# Patient Record
Sex: Male | Born: 1980 | Race: Black or African American | Hispanic: No | Marital: Single | Smoking: Current some day smoker
Health system: Southern US, Community
[De-identification: ages and names within clinical notes are randomized; demographics above are authoritative.]

## PROBLEM LIST (undated history)

## (undated) DIAGNOSIS — I2699 Other pulmonary embolism without acute cor pulmonale: Secondary | ICD-10-CM

---
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
## 2001-09-17 ENCOUNTER — Emergency Department (HOSPITAL_COMMUNITY): Admission: EM | Admit: 2001-09-17 | Discharge: 2001-09-17 | Payer: Self-pay | Admitting: Emergency Medicine

## 2001-09-17 ENCOUNTER — Encounter: Payer: Self-pay | Admitting: Emergency Medicine

## 2002-07-17 ENCOUNTER — Encounter: Payer: Self-pay | Admitting: Emergency Medicine

## 2002-07-17 ENCOUNTER — Emergency Department (HOSPITAL_COMMUNITY): Admission: EM | Admit: 2002-07-17 | Discharge: 2002-07-17 | Payer: Self-pay | Admitting: *Deleted

## 2006-12-04 ENCOUNTER — Emergency Department (HOSPITAL_COMMUNITY): Admission: EM | Admit: 2006-12-04 | Discharge: 2006-12-04 | Payer: Self-pay | Admitting: Emergency Medicine

## 2007-04-12 ENCOUNTER — Emergency Department (HOSPITAL_COMMUNITY): Admission: EM | Admit: 2007-04-12 | Discharge: 2007-04-13 | Payer: Self-pay | Admitting: *Deleted

## 2007-12-29 ENCOUNTER — Emergency Department (HOSPITAL_COMMUNITY): Admission: EM | Admit: 2007-12-29 | Discharge: 2007-12-29 | Payer: Self-pay | Admitting: Emergency Medicine

## 2008-03-15 ENCOUNTER — Emergency Department (HOSPITAL_COMMUNITY): Admission: EM | Admit: 2008-03-15 | Discharge: 2008-03-15 | Payer: Self-pay | Admitting: Emergency Medicine

## 2008-07-25 ENCOUNTER — Emergency Department (HOSPITAL_COMMUNITY): Admission: EM | Admit: 2008-07-25 | Discharge: 2008-07-25 | Payer: Self-pay | Admitting: Emergency Medicine

## 2010-06-30 ENCOUNTER — Emergency Department (HOSPITAL_COMMUNITY): Admission: EM | Admit: 2010-06-30 | Discharge: 2010-06-30 | Payer: Self-pay | Admitting: Emergency Medicine

## 2010-08-08 ENCOUNTER — Emergency Department (HOSPITAL_COMMUNITY): Admission: EM | Admit: 2010-08-08 | Discharge: 2010-08-08 | Payer: Self-pay | Admitting: Emergency Medicine

## 2011-02-21 LAB — CBC
HCT: 37.9 % — ABNORMAL LOW (ref 39.0–52.0)
Hemoglobin: 12.8 g/dL — ABNORMAL LOW (ref 13.0–17.0)
WBC: 6.9 10*3/uL (ref 4.0–10.5)

## 2011-02-21 LAB — DIFFERENTIAL
Lymphocytes Relative: 33 % (ref 12–46)
Lymphs Abs: 2.3 10*3/uL (ref 0.7–4.0)
Monocytes Absolute: 0.5 10*3/uL (ref 0.1–1.0)
Monocytes Relative: 7 % (ref 3–12)
Neutro Abs: 3.9 10*3/uL (ref 1.7–7.7)

## 2011-02-21 LAB — POCT I-STAT, CHEM 8
BUN: 14 mg/dL (ref 6–23)
Calcium, Ion: 1 mmol/L — ABNORMAL LOW (ref 1.12–1.32)
Chloride: 106 mEq/L (ref 96–112)
Creatinine, Ser: 1.3 mg/dL (ref 0.4–1.5)
Glucose, Bld: 86 mg/dL (ref 70–99)

## 2011-02-23 LAB — DIFFERENTIAL
Basophils Relative: 1 % (ref 0–1)
Monocytes Absolute: 0.9 10*3/uL (ref 0.1–1.0)
Monocytes Relative: 8 % (ref 3–12)
Neutro Abs: 9 10*3/uL — ABNORMAL HIGH (ref 1.7–7.7)

## 2011-02-23 LAB — POCT I-STAT, CHEM 8
Calcium, Ion: 1.08 mmol/L — ABNORMAL LOW (ref 1.12–1.32)
Chloride: 107 mEq/L (ref 96–112)
Glucose, Bld: 104 mg/dL — ABNORMAL HIGH (ref 70–99)
HCT: 39 % (ref 39.0–52.0)

## 2011-02-23 LAB — CBC
HCT: 36 % — ABNORMAL LOW (ref 39.0–52.0)
MCHC: 33.4 g/dL (ref 30.0–36.0)
MCV: 90.7 fL (ref 78.0–100.0)
RDW: 14.1 % (ref 11.5–15.5)
WBC: 11.1 10*3/uL — ABNORMAL HIGH (ref 4.0–10.5)

## 2011-10-22 ENCOUNTER — Encounter: Payer: Self-pay | Admitting: Emergency Medicine

## 2011-10-22 ENCOUNTER — Emergency Department (HOSPITAL_COMMUNITY)
Admission: EM | Admit: 2011-10-22 | Discharge: 2011-10-22 | Disposition: A | Discharge status: Home / Self Care | Payer: Self-pay | Attending: Emergency Medicine | Admitting: Emergency Medicine

## 2011-10-22 ENCOUNTER — Emergency Department (HOSPITAL_COMMUNITY): Payer: Self-pay

## 2011-10-22 DIAGNOSIS — F17200 Nicotine dependence, unspecified, uncomplicated: Secondary | ICD-10-CM | POA: Insufficient documentation

## 2011-10-22 DIAGNOSIS — Y9367 Activity, basketball: Secondary | ICD-10-CM | POA: Insufficient documentation

## 2011-10-22 DIAGNOSIS — S62300A Unspecified fracture of second metacarpal bone, right hand, initial encounter for closed fracture: Secondary | ICD-10-CM

## 2011-10-22 DIAGNOSIS — M79609 Pain in unspecified limb: Secondary | ICD-10-CM | POA: Insufficient documentation

## 2011-10-22 DIAGNOSIS — R296 Repeated falls: Secondary | ICD-10-CM | POA: Insufficient documentation

## 2011-10-22 DIAGNOSIS — S62339A Displaced fracture of neck of unspecified metacarpal bone, initial encounter for closed fracture: Secondary | ICD-10-CM | POA: Insufficient documentation

## 2011-10-22 NOTE — ED Provider Notes (Signed)
History     CSN: [id number] Arrival date & time: 10/22/2011  2:02 AM   First MD Initiated Contact with Patient 10/22/11 0507      Chief Complaint  Patient presents with  . Hand Injury    HPI History provided by the patient. Patient presents with complaints of right hand pain following a basketball injury 2 days ago. Patient states he landed on the ground with partially close right hand. He has pain with movement of index finger and fifth finger. He denies any swelling to hand, no wrist pain, no numbness or tingling. Patient has not taken anything for the pain.   History reviewed. No pertinent past medical history.  History reviewed. No pertinent past surgical history.  History reviewed. No pertinent family history.  History  Substance Use Topics  . Smoking status: Current Everyday Smoker  . Smokeless tobacco: Not on file  . Alcohol Use: Yes     OCCASIONAL      Review of Systems  All other systems reviewed and are negative.    Allergies  Review of patient's allergies indicates no known allergies.  Home Medications  No current outpatient prescriptions on file.  BP 104/59  Pulse 71  Temp(Src) 98.4 F (36.9 C) (Oral)  Resp 22  SpO2 97%  Physical Exam  Nursing note and vitals reviewed. Constitutional: He is oriented to person, place, and time. He appears well-developed and well-nourished.  Cardiovascular: Normal rate and regular rhythm.   Pulmonary/Chest: Effort normal.  Musculoskeletal: Normal range of motion. He exhibits no edema.       Full range of motion in right wrist, hand and digits. Patient reports pain especially with flexion of index finger and fifth finger. There are normal distal sensation and cap refill less than 2 seconds. Patient has normal strength in fingers. He has tenderness to palpation about the distal second metacarpal and mid fifth metacarpal area. No snuffbox tenderness.  Neurological: He is alert and oriented to person, place, and time.    Skin: No rash noted. No erythema.    ED Course  Procedures (including critical care time)  Labs Reviewed - No data to display Dg Hand Complete Right  10/22/2011  *RADIOLOGY REPORT*  Clinical Data:  Larey Seat on closed fist; pain at the second, third and fifth metacarpal phalangeal joints.  RIGHT HAND - COMPLETE 3+ VIEW  Comparison: Right hand radiographs performed 03/15/2008  Findings: There is a mildly depressed comminuted fracture involving the ulnar aspect of the head of the second metacarpal; the chronicity of this fracture is difficult to determine, though it could be acute.  There is stable chronic deformity of the distal fifth metacarpal. No additional fractures are seen.  The joint spaces are preserved; no significant soft tissue abnormalities are characterized on radiograph.  The carpal rows are intact, and demonstrate normal alignment.  IMPRESSION: Mildly depressed comminuted fracture involving the ulnar aspect of the head of the second metacarpal; the chronicity of this fracture is difficult to determine, though it could be acute.  It is new from 2009.  Original Report Authenticated By: Tonia Ghent, M.D.     1. Fracture of second metacarpal bone of right hand     MDM  Patient seen and evaluated. Patient in no acute distress. X-ray shows fracture to distal second metacarpal. Will place patient in splint and give him followup.          Angus Seller, [place] 10/22/11 [phone]

## 2011-10-22 NOTE — ED Notes (Signed)
Splint applied by ortho tech

## 2011-10-22 NOTE — ED Notes (Signed)
Ortho tech paged  

## 2011-10-22 NOTE — ED Notes (Signed)
PT. INJURED RIGHT HAND WHILE PLAYING BASKETBALL TODAY WITH PAIN AND SLIGHT SWELLING.

## 2011-10-22 NOTE — ED Provider Notes (Signed)
Medical screening examination/treatment/procedure(s) were performed by non-physician practitioner and as supervising physician I was immediately available for consultation/collaboration.   Lyanne Co, MD 10/22/11 [phone]

## 2011-11-12 ENCOUNTER — Encounter (HOSPITAL_COMMUNITY): Payer: Self-pay | Admitting: Emergency Medicine

## 2011-11-12 ENCOUNTER — Emergency Department (HOSPITAL_COMMUNITY): Payer: Self-pay

## 2011-11-12 ENCOUNTER — Emergency Department (HOSPITAL_COMMUNITY)
Admission: EM | Admit: 2011-11-12 | Discharge: 2011-11-12 | Discharge status: Against Medical Advice | Payer: Self-pay | Attending: Emergency Medicine | Admitting: Emergency Medicine

## 2011-11-12 DIAGNOSIS — M25539 Pain in unspecified wrist: Secondary | ICD-10-CM | POA: Insufficient documentation

## 2011-11-12 DIAGNOSIS — F17200 Nicotine dependence, unspecified, uncomplicated: Secondary | ICD-10-CM | POA: Insufficient documentation

## 2011-11-12 NOTE — ED Notes (Signed)
PT. TRIPPED WHILE WALKING ON A STREET AND FELL , PRESENTS WITH LEFT WRIST PAIN WITH SWELLING . NO LOC . AMBULATORY.

## 2011-11-12 NOTE — ED Notes (Signed)
Called patient 3 times and NO ANSWER. 

## 2011-11-13 NOTE — ED Provider Notes (Signed)
History     CSN: [id number] Arrival date & time: 11/12/2011  7:38 PM   First MD Initiated Contact with Patient 11/12/11 2245      Chief Complaint  Patient presents with  . Wrist Injury    (Consider location/radiation/quality/duration/timing/severity/associated sxs/prior treatment) Patient is a 29 y.o. male presenting with wrist injury.  Wrist Injury     History reviewed. No pertinent past medical history.  History reviewed. No pertinent past surgical history.  No family history on file.  History  Substance Use Topics  . Smoking status: Current Everyday Smoker  . Smokeless tobacco: Not on file  . Alcohol Use: Yes     OCCASIONAL      Review of Systems  Allergies  Review of patient's allergies indicates no known allergies.  Home Medications  No current outpatient prescriptions on file.  BP 113/71  Pulse 87  Temp(Src) 98.2 F (36.8 C) (Oral)  Resp 20  SpO2 100%  Physical Exam  ED Course  Procedures (including critical care time)  Labs Reviewed - No data to display No results found.   1. Wrist pain       MDM  AMA         Arman Filter, NP 11/13/11 2130  Arman Filter, NP 11/13/11 8657  Arman Filter, NP 11/17/11 2259

## 2011-11-17 NOTE — ED Provider Notes (Signed)
Medical screening examination/treatment/procedure(s) were performed by non-physician practitioner and as supervising physician I was immediately available for consultation/collaboration.   Hanley Seamen, MD 11/17/11 2300

## 2012-09-09 ENCOUNTER — Emergency Department (HOSPITAL_COMMUNITY)
Admission: EM | Admit: 2012-09-09 | Discharge: 2012-09-09 | Disposition: A | Discharge status: Home / Self Care | Payer: Self-pay | Attending: Emergency Medicine | Admitting: Emergency Medicine

## 2012-09-09 ENCOUNTER — Emergency Department (HOSPITAL_COMMUNITY): Payer: Self-pay

## 2012-09-09 ENCOUNTER — Encounter (HOSPITAL_COMMUNITY): Payer: Self-pay | Admitting: *Deleted

## 2012-09-09 DIAGNOSIS — M79606 Pain in leg, unspecified: Secondary | ICD-10-CM

## 2012-09-09 DIAGNOSIS — F17200 Nicotine dependence, unspecified, uncomplicated: Secondary | ICD-10-CM | POA: Insufficient documentation

## 2012-09-09 DIAGNOSIS — M79609 Pain in unspecified limb: Secondary | ICD-10-CM | POA: Insufficient documentation

## 2012-09-09 MED ORDER — IBUPROFEN 600 MG PO TABS: 600.0000 mg | ORAL_TABLET | Freq: Four times a day (QID) | ORAL | Status: DC | PRN

## 2012-09-09 MED ORDER — OXYCODONE-ACETAMINOPHEN 5-325 MG PO TABS: 1.0000 | ORAL_TABLET | ORAL | Status: DC | PRN

## 2012-09-09 NOTE — ED Notes (Signed)
Patient transported to X-ray 

## 2012-09-09 NOTE — ED Notes (Signed)
Dr. Wickline at bedside.  

## 2012-09-09 NOTE — ED Notes (Signed)
Ortho paged and responded.  Pt sleeping.

## 2012-09-09 NOTE — ED Notes (Signed)
The pt has had lt lower leg pain for 3 weeks.  No known injury

## 2012-09-09 NOTE — ED Provider Notes (Signed)
History     CSN: [id number]  Arrival date & time 09/09/12  0145   First MD Initiated Contact with Patient 09/09/12 [phone]      Chief Complaint  Patient presents with  . Leg Pain     Patient is a 31 y.o. male presenting with leg pain. The history is provided by the patient.  Leg Pain  The incident occurred more than 1 week ago. There was no injury mechanism. Pain location: left distal tibial surface. The pain is mild. The pain has been constant since onset. Pertinent negatives include no numbness, no inability to bear weight, no loss of motion, no muscle weakness, no loss of sensation and no tingling. The symptoms are aggravated by activity and bearing weight. He has tried rest for the symptoms. The treatment provided mild relief.  pt reports left LE pain for at least 3 weeks No trauma.  It only hurts to walk or when he dorsiflexes foot and it on the anterior distal tibial surface He thinks it is swollen He reports he walks daily and is not sedentary No h/o DVT No travel No active CP/SOB reported    History reviewed. No pertinent past surgical history.  No family history on file.  History  Substance Use Topics  . Smoking status: Current Every Day Smoker  . Smokeless tobacco: Not on file  . Alcohol Use: Yes     OCCASIONAL      Review of Systems  Constitutional: Negative for fever.  Neurological: Negative for tingling and numbness.    Allergies  Review of patient's allergies indicates no known allergies.  Home Medications  No current outpatient prescriptions on file.  BP 113/69  Pulse 87  Temp 99 F (37.2 C) (Oral)  Resp 16  SpO2 98%  Physical Exam CONSTITUTIONAL: Well developed/well nourished HEAD AND FACE: Normocephalic/atraumatic EYES: EOMI/PERRL ENMT: Mucous membranes moist NECK: supple no meningeal signs SPINE:entire spine nontender CV: S1/S2 noted, no murmurs/rubs/gallops noted LUNGS: Lungs are clear to auscultation bilaterally, no apparent  distress ABDOMEN: soft, nontender, no rebound or guarding GU:no cva tenderness NEURO: Pt is awake/alert, moves all extremitiesx4 EXTREMITIES: pulses normal, full ROM.  Tenderness to anterior surface of distal left tibia.  No erythema but mild swelling noted to the area that is tender.  No other edema to left LE.  No calf tenderness and no erythema.  Distal pulses equal/intact.  No motor/sensory deficits noted.  Left achilles intact.  Legs otherwise appear symmetric SKIN: warm, color normal PSYCH: no abnormalities of mood noted  ED Course  Procedures   ddimer negative.  Xray neg for fx No signs of cellulitis Distal neurovascular intact Referred to ortho MDM  Nursing notes including past medical history and social history reviewed and considered in documentation xrays reviewed and considered Labs/vital reviewed and considered         Joya Gaskins, MD 09/09/12 [phone]

## 2015-11-27 ENCOUNTER — Encounter (HOSPITAL_COMMUNITY): Payer: Self-pay

## 2015-11-27 ENCOUNTER — Emergency Department (HOSPITAL_COMMUNITY): Payer: Self-pay

## 2015-11-27 ENCOUNTER — Observation Stay (HOSPITAL_BASED_OUTPATIENT_CLINIC_OR_DEPARTMENT_OTHER): Payer: Self-pay

## 2015-11-27 ENCOUNTER — Observation Stay (HOSPITAL_COMMUNITY)
Admission: EM | Admit: 2015-11-27 | Discharge: 2015-11-28 | Disposition: A | Discharge status: Home / Self Care | Payer: Self-pay | Attending: Internal Medicine | Admitting: Internal Medicine

## 2015-11-27 DIAGNOSIS — Z79899 Other long term (current) drug therapy: Secondary | ICD-10-CM | POA: Insufficient documentation

## 2015-11-27 DIAGNOSIS — F17200 Nicotine dependence, unspecified, uncomplicated: Secondary | ICD-10-CM | POA: Insufficient documentation

## 2015-11-27 DIAGNOSIS — Z86711 Personal history of pulmonary embolism: Secondary | ICD-10-CM | POA: Diagnosis present

## 2015-11-27 DIAGNOSIS — I2699 Other pulmonary embolism without acute cor pulmonale: Secondary | ICD-10-CM

## 2015-11-27 DIAGNOSIS — R0781 Pleurodynia: Secondary | ICD-10-CM | POA: Insufficient documentation

## 2015-11-27 DIAGNOSIS — Z720 Tobacco use: Secondary | ICD-10-CM | POA: Insufficient documentation

## 2015-11-27 LAB — CBC WITH DIFFERENTIAL/PLATELET
BASOS ABS: 0 10*3/uL (ref 0.0–0.1)
BASOS PCT: 1 %
Eosinophils Absolute: 0.4 10*3/uL (ref 0.0–0.7)
Eosinophils Relative: 5 %
HEMATOCRIT: 45.7 % (ref 39.0–52.0)
HEMOGLOBIN: 14.9 g/dL (ref 13.0–17.0)
LYMPHS ABS: 1.7 10*3/uL (ref 0.7–4.0)
LYMPHS PCT: 20 %
MCH: 30.3 pg (ref 26.0–34.0)
MCHC: 32.6 g/dL (ref 30.0–36.0)
MCV: 92.9 fL (ref 78.0–100.0)
MONOS PCT: 8 %
Monocytes Absolute: 0.7 10*3/uL (ref 0.1–1.0)
Neutro Abs: 5.8 10*3/uL (ref 1.7–7.7)
Neutrophils Relative %: 66 %
Platelets: 290 10*3/uL (ref 150–400)
RBC: 4.92 MIL/uL (ref 4.22–5.81)
RDW: 13.7 % (ref 11.5–15.5)
WBC: 8.6 10*3/uL (ref 4.0–10.5)

## 2015-11-27 LAB — BASIC METABOLIC PANEL
ANION GAP: 10 (ref 5–15)
BUN: 12 mg/dL (ref 6–20)
CHLORIDE: 103 mmol/L (ref 101–111)
CO2: 27 mmol/L (ref 22–32)
Calcium: 10.3 mg/dL (ref 8.9–10.3)
Creatinine, Ser: 1.22 mg/dL (ref 0.61–1.24)
GFR calc non Af Amer: >60 mL/min (ref >60)
Glucose, Bld: 78 mg/dL (ref 65–99)
Potassium: 4.1 mmol/L (ref 3.5–5.1)
Sodium: 140 mmol/L (ref 135–145)

## 2015-11-27 LAB — ANTITHROMBIN III: ANTITHROMB III FUNC: 108 % (ref 75–120)

## 2015-11-27 MED ORDER — METHOCARBAMOL 500 MG PO TABS: 500.0000 mg | ORAL_TABLET | Freq: Three times a day (TID) | ORAL | Status: DC | PRN

## 2015-11-27 MED ORDER — IOHEXOL 350 MG/ML SOLN
100.0000 mL | Freq: Once | INTRAVENOUS | Status: AC | PRN
  Administered 2015-11-27: 100 mL via INTRAVENOUS

## 2015-11-27 MED ORDER — SODIUM CHLORIDE 0.9 % IV SOLN
INTRAVENOUS | Status: DC
  Administered 2015-11-27: 15:00:00 via INTRAVENOUS

## 2015-11-27 MED ORDER — OXYCODONE HCL 5 MG PO TABS
5.0000 mg | ORAL_TABLET | ORAL | Status: DC | PRN
  Administered 2015-11-27 – 2015-11-28 (×5): 10 mg via ORAL
  Filled 2015-11-27 (×5): qty 2

## 2015-11-27 MED ORDER — RIVAROXABAN 15 MG PO TABS
15.0000 mg | ORAL_TABLET | Freq: Two times a day (BID) | ORAL | Status: DC
  Administered 2015-11-27 – 2015-11-28 (×2): 15 mg via ORAL
  Filled 2015-11-27 (×5): qty 1

## 2015-11-27 MED ORDER — RIVAROXABAN 15 MG PO TABS
15.0000 mg | ORAL_TABLET | Freq: Once | ORAL | Status: AC
  Administered 2015-11-27: 15 mg via ORAL
  Filled 2015-11-27: qty 1

## 2015-11-27 MED ORDER — SODIUM CHLORIDE 0.9 % IV BOLUS (SEPSIS)
1000.0000 mL | Freq: Once | INTRAVENOUS | Status: AC
  Administered 2015-11-27: 1000 mL via INTRAVENOUS

## 2015-11-27 MED ORDER — RIVAROXABAN (XARELTO) EDUCATION KIT FOR DVT/PE PATIENTS
PACK | Freq: Once | Status: AC
  Administered 2015-11-27: 11:00:00
  Filled 2015-11-27: qty 1

## 2015-11-27 MED ORDER — MORPHINE SULFATE (PF) 2 MG/ML IV SOLN
2.0000 mg | INTRAVENOUS | Status: DC | PRN
  Administered 2015-11-27: 2 mg via INTRAVENOUS
  Administered 2015-11-27: 4 mg via INTRAVENOUS
  Filled 2015-11-27 (×2): qty 2

## 2015-11-27 NOTE — ED Notes (Addendum)
Pt BIB [facility] complaining of R sided chest/rib cage pain that hurts worse when he lifts it. Denies N/V/SOB. Pain 10/10. Recalls no source of injury or reason for the pain.

## 2015-11-27 NOTE — Progress Notes (Signed)
ANTICOAGULATION CONSULT NOTE - Initial Consult  Pharmacy Consult for Xarelto Indication: pulmonary embolus  No Known Allergies  Patient Measurements: Height: 6\' 8"  (203.2 cm) Weight: 230 lb (104.327 kg) IBW/kg (Calculated) : 96  Vital Signs: Temp: 98.7 F (37.1 C) (12/19 0020) Temp Source: Oral (12/19 0020) BP: 131/94 mmHg (12/19 0531) Pulse Rate: 69 (12/19 0531)  Labs:  Recent Labs  11/27/15 0245  HGB 14.9  HCT 45.7  PLT 290  CREATININE 1.22    Estimated Creatinine Clearance: 115.8 mL/min (by C-G formula based on Cr of 1.22).   Medical History: History reviewed. No pertinent past medical history.  Medications:  Scheduled:  . rivaroxaban   Does not apply Once  . Rivaroxaban  15 mg Oral Once  . Rivaroxaban  15 mg Oral BID WC   Infusions:    Assessment:  [age] yr male with no significant PMH.  On no meds prior to admission. Presents to ED with c/o chest pain.  CT Angio shows acute pulmonary embolism  Xarelto 15mg  po x 1 ordered in the ED and upon admission pharmacy requested to dose Xarelto  Goal of Therapy:  Full anticoagulation Monitor platelets by anticoagulation protocol: Yes   Plan:   Xarelto 15mg  po BID x 21 days followed by 20mg  daily  Xarelto education  Susie Pousson, Joselyn GlassmanLeann Trefz, PharmD 11/27/2015,5:37 AM

## 2015-11-27 NOTE — Progress Notes (Signed)
PT demonstrated verbal and hands on understanding of Flutter device. 

## 2015-11-27 NOTE — Discharge Instructions (Signed)
Information on my medicine - XARELTO (rivaroxaban)  This medication education was reviewed with me or my healthcare representative as part of my discharge preparation.  The pharmacist that spoke with me during my hospital stay was:  Maurice MarchJackson, Nick Stults E, [facility]  WHY WAS Carlena HurlXARELTO PRESCRIBED FOR YOU? Xarelto was prescribed to treat blood clots that may have been found in the veins of your legs (deep vein thrombosis) or in your lungs (pulmonary embolism) and to reduce the risk of them occurring again.  What do you need to know about Xarelto? The starting dose is one 15 mg tablet taken TWICE daily with food for the FIRST 21 DAYS then on (enter date)  Jan 10,17  the dose is changed to one 20 mg tablet taken ONCE A DAY with your evening meal.  DO NOT stop taking Xarelto without talking to the health care provider who prescribed the medication.  Refill your prescription for 20 mg tablets before you run out.  After discharge, you should have regular check-up appointments with your healthcare provider that is prescribing your Xarelto.  In the future your dose may need to be changed if your kidney function changes by a significant amount.  What do you do if you miss a dose? If you are taking Xarelto TWICE DAILY and you miss a dose, take it as soon as you remember. You may take two 15 mg tablets (total 30 mg) at the same time then resume your regularly scheduled 15 mg twice daily the next day.  If you are taking Xarelto ONCE DAILY and you miss a dose, take it as soon as you remember on the same day then continue your regularly scheduled once daily regimen the next day. Do not take two doses of Xarelto at the same time.   Important Safety Information Xarelto is a blood thinner medicine that can cause bleeding. You should call your healthcare provider right away if you experience any of the following: ? Bleeding from an injury or your nose that does not stop. ? Unusual colored urine (red or dark brown)  or unusual colored stools (red or black). ? Unusual bruising for unknown reasons. ? A serious fall or if you hit your head (even if there is no bleeding).  Some medicines may interact with Xarelto and might increase your risk of bleeding while on Xarelto. To help avoid this, consult your healthcare provider or pharmacist prior to using any new prescription or non-prescription medications, including herbals, vitamins, non-steroidal anti-inflammatory drugs (NSAIDs) and supplements.  This website has more information on Xarelto: [email or web address].xarelto.[email or web address].

## 2015-11-27 NOTE — ED Notes (Signed)
Pt to xray

## 2015-11-27 NOTE — Progress Notes (Signed)
Pt admitted to 1331, no family/visitors in attendance. Pt with obvious discomfort when moving from stretcher to bed, just received pain medication about 20 min prior to arrival. Pain is in right ribs/axilla. No shortness of breath, VS stable. Oriented to room/unit, instructed how to order breakfast. Pt has no insurance or PCP, concerned with how he will get medications and follow up on his PE. Case management consult ordered. Continue to monitor. Mick SellShannon Pinky Ravan RN

## 2015-11-27 NOTE — ED Provider Notes (Signed)
CSN: [id number]     Arrival date & time 11/27/15  0010 History  By signing my name below, I, Phillis Haggis, attest that this documentation has been prepared under the direction and in the presence of Gilda Crease, MD. Electronically Signed: Phillis Haggis, ED Scribe. 11/27/2015. 1:54 AM.   Chief Complaint  Patient presents with  . Chest Pain    rib   The history is provided by the patient. No language interpreter was used.  HPI Comments: Craig Daugherty is a 34 y.o. Male brought in by [facility] who presents to the Emergency Department complaining of right sided chest pain and rib cage pain that he rates 10/10 onset one day ago. He reports worsening pain with arm lifting, coughing, or burping. He denies a hx of similar symptoms or taking anything for his symptoms PTA. He denies injury to the area, recent falls, SOB, nausea, or vomiting.   History reviewed. No pertinent past medical history. History reviewed. No pertinent past surgical history. History reviewed. No pertinent family history. Social History  Substance Use Topics  . Smoking status: Current Every Day Smoker  . Smokeless tobacco: None  . Alcohol Use: Yes     Comment: OCCASIONAL    Review of Systems  Respiratory: Negative for shortness of breath.   Cardiovascular: Positive for chest pain.  Gastrointestinal: Negative for nausea and vomiting.  All other systems reviewed and are negative.  Allergies  Review of patient's allergies indicates no known allergies.  Home Medications   Prior to Admission medications   Not on File   BP 114/69 mmHg  Pulse 71  Temp(Src) 98.7 F (37.1 C) (Oral)  Resp 18  SpO2 100% Physical Exam  Constitutional: He is oriented to person, place, and time. He appears well-developed and well-nourished. No distress.  HENT:  Head: Normocephalic and atraumatic.  Right Ear: Hearing normal.  Left Ear: Hearing normal.  Nose: Nose normal.  Mouth/Throat: Oropharynx is clear and moist and mucous  membranes are normal.  Eyes: Conjunctivae and EOM are normal. Pupils are equal, round, and reactive to light.  Neck: Normal range of motion. Neck supple.  Cardiovascular: Regular rhythm, S1 normal and S2 normal.  Exam reveals no gallop and no friction rub.   No murmur heard. Pulmonary/Chest: Effort normal and breath sounds normal. No respiratory distress. He exhibits no tenderness.  Right lateral chest wall tenderness with no crepitus, increased pain with ROM of arm and torso  Abdominal: Soft. Normal appearance and bowel sounds are normal. There is no hepatosplenomegaly. There is no tenderness. There is no rebound, no guarding, no tenderness at McBurney's point and negative Murphy's sign. No hernia.  Musculoskeletal: Normal range of motion.  Neurological: He is alert and oriented to person, place, and time. He has normal strength. No cranial nerve deficit or sensory deficit. Coordination normal. GCS eye subscore is 4. GCS verbal subscore is 5. GCS motor subscore is 6.  Skin: Skin is warm, dry and intact. No rash noted. No cyanosis.  Psychiatric: He has a normal mood and affect. His speech is normal and behavior is normal. Thought content normal.  Nursing note and vitals reviewed.   ED Course  Procedures (including critical care time) DIAGNOSTIC STUDIES: Oxygen Saturation is 100% on RA, normal by my interpretation.    COORDINATION OF CARE: 12:34 AM-Discussed treatment plan which includes x-ray with pt at bedside and pt agreed to plan.    Labs Review Labs Reviewed  CBC WITH DIFFERENTIAL/PLATELET  BASIC METABOLIC PANEL  Imaging Review Dg Chest 2 View  11/27/2015  CLINICAL DATA:  Right-sided chest pain with inspiration. No mention of trauma. EXAM: CHEST  2 VIEW COMPARISON:  07/11/2010 FINDINGS: There is an indistinct peripheral opacity at the right base with small pleural effusion seen laterally. No convincing left-sided lung opacity. Normal heart size and aortic contours. IMPRESSION:  1. Small opacity at the right base, nonspecific with this clinical history. Atelectasis, pneumonia, or lung infarct could have this appearance. 2. Trace right pleural effusion. Electronically Signed   By: Marnee SpringJonathon  Watts M.D.   On: 11/27/2015 01:51   Ct Angio Chest Pe W/cm &/or Wo Cm  11/27/2015  CLINICAL DATA:  Right-sided chest pain EXAM: CT ANGIOGRAPHY CHEST WITH CONTRAST TECHNIQUE: Multidetector CT imaging of the chest was performed using the standard protocol during bolus administration of intravenous contrast. Multiplanar CT image reconstructions and MIPs were obtained to evaluate the vascular anatomy. CONTRAST:  100mL OMNIPAQUE IOHEXOL 350 MG/ML SOLN COMPARISON:  None. FINDINGS: THORACIC INLET/BODY WALL: No acute abnormality. MEDIASTINUM: There is multi focal central, branch point pulmonary artery filling defects consistent with acute emboli. These are segmental to subsegmental and affect the lateral segment right middle lobe and basilar segments right lower lobe. Although distorted by motion, there is also an anterior basilar segment subsegmental embolus on the left. There is no evidence of right heart strain (RV to LV ratio is less than 0.9) but there is typical infarct changes in the anterior basilar segment right lower lobe. Atelectatic opacity in the left lower lobe, likely also ischemic. Small right pleural effusion. Prominent size of right hilar lymph nodes, nonspecific in this setting. LUNG WINDOWS: Pulmonary findings described above. UPPER ABDOMEN: No acute findings. OSSEOUS: No acute fracture.  No suspicious lytic or blastic lesions. Critical Value/emergent results were called by telephone at the time of interpretation on 11/27/2015 at 4:24 am to Dr. Jaci CarrelHRISTOPHER Keagan Anthis , who verbally acknowledged these results. Review of the MIP images confirms the above findings. 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 No indication of right heart strain. Electronically  Signed   By: Marnee SpringJonathon  Watts M.D.   On: 11/27/2015 04:27   I have personally reviewed and evaluated these images and lab results as part of my medical decision-making.   EKG Interpretation None      MDM   Final diagnoses:  Other acute pulmonary embolism without acute cor pulmonale (HCC)   pulmonary embolism  Patient presents to the ER for evaluation of pain in the right rib cage that started yesterday. Patient reports that it hurts to move his arm, bend or twist or take a deep breath. He denies any direct injury. Chest x-ray performed to further evaluate for possible pulmonary pathology. There was opacity noted that was consistent with either pneumonia or possibly an infarct. Further workup was therefore performed including CT angiography which revealed multiple pulmonary emboli in the right lung and possibly one in the left. Patient will require hospitalization for initiation of anticoagulation due to multiple PE with pulmonary infarct.  I personally performed the services described in this documentation, which was scribed in my presence. The recorded information has been reviewed and is accurate.     Gilda Creasehristopher J Efton Thomley, MD 11/27/15 [phone]

## 2015-11-27 NOTE — Progress Notes (Signed)
Patient seen and examined. Admitted after midnight secondary to excruciating pleuritic CP and was found to have a PE with right lower lobe infarct. No fever, no signs of infection/infiltrates. Patient w/o prior hx of clots or clots disorder in his family. Only PMH is Tobacco abuse. Please refer to H&P written by Dr. Julian ReilGardner for further info/details on admission.  Plan: -PRN analgesics -xarelto for anticoagulation purposes  -flutter valve -PRN oxygen supplementation -LE duplex negative -will follow clinical response   Vassie LollMadera, Haelyn Forgey [phone]

## 2015-11-27 NOTE — ED Notes (Signed)
Pt resting in bed with eyes closed. Rise and fall noted to pt's chest.

## 2015-11-27 NOTE — Care Management Note (Signed)
Case Management Note  Patient Details  Name: Craig Daugherty MRN: [id number] Date of Birth: 1981-07-05  Subjective/Objective:          34 yo admitted with PE         Action/Plan: From home with mother  Expected Discharge Date:                  Expected Discharge Plan:  Home/Self Care  In-House Referral:     Discharge planning Services  Follow-up appt scheduled, Medication Assistance, CM Consult  Post Acute Care Choice:    Choice offered to:     DME Arranged:    DME Agency:     HH Arranged:    HH Agency:     Status of Service:  Completed, signed off  Medicare Important Message Given:    Date Medicare IM Given:    Medicare IM give by:    Date Additional Medicare IM Given:    Additional Medicare Important Message give by:     If discussed at Long Length of Stay Meetings, dates discussed:    Additional Comments: Pt to DC home on Xeralto for approximately 6 months. Pt to receive free 30 day card from hospital pharmacist and [facility] at discharge. This CM filled out [employer] assistance program paperwork for remainder of Xeralto needed. Xeralto paperwork faxed to the number provided on the [employer] form. Per [employer] representative, pt will be sent a prescription card in the mail and contacted by [employer] company at home. This CM made pt a follow [facility] Appointment placed on the AVS. Dr. Gwenlyn PerkingMadera to give pt prescription for remaining 5 months of Xeralto to be filled at any pharmacy with [facility] sent to him by mail. All information discussed with pt and mother at the bedside and all questions answered. No other CM needs identified. Bartholome BillCLEMENTS, Nayanna Seaborn H, RN 11/27/2015, 1:12 PM

## 2015-11-27 NOTE — Progress Notes (Signed)
*  Preliminary Results* Bilateral lower extremity venous duplex completed. Bilateral lower extremities are negative for deep vein thrombosis. There is no evidence of Baker's cyst bilaterally.  11/27/2015  Gertie FeyMichelle Leyana Whidden, RVT, RDCS, RDMS

## 2015-11-27 NOTE — H&P (Signed)
[facility] History and Physical  Craig BasqueDanollen Clagett ZOX:[id number] DOB: 1981-04-21 DOA: 11/27/2015  Referring physician: EDP PCP: Default, Provider, MD   Chief Complaint: Chest pain   HPI: Craig Daugherty is a 34 y.o. male who presents to the ED with c/o R sided chest and rib cage pain.  10/10 in severity.  Symptoms onset 1 day ago.  Worse with movement.  No h/o similar symptoms previously.  No h/o blood clots, no family history of blood clots, no history of travel.  Surprisingly patient has a PE.  Review of Systems: Systems reviewed.  As above, otherwise negative  History reviewed. No pertinent past medical history. History reviewed. No pertinent past surgical history. Social History:  reports that he has been smoking.  He does not have any smokeless tobacco history on file. He reports that he drinks alcohol. He reports that he does not use illicit drugs.  No Known Allergies  History reviewed. No pertinent family history.   Prior to Admission medications   Not on File   Physical Exam: Filed Vitals:   11/27/15 0020  BP: 114/69  Pulse: 71  Temp: 98.7 F (37.1 C)  Resp: 18    BP 114/69 mmHg  Pulse 71  Temp(Src) 98.7 F (37.1 C) (Oral)  Resp 18  SpO2 100%  General Appearance:    Alert, oriented, no distress, appears stated age  Head:    Normocephalic, atraumatic  Eyes:    PERRL, EOMI, sclera non-icteric        Nose:   Nares without drainage or epistaxis. Mucosa, turbinates normal  Throat:   Moist mucous membranes. Oropharynx without erythema or exudate.  Neck:   Supple. No carotid bruits.  No thyromegaly.  No lymphadenopathy.   Back:     No CVA tenderness, no spinal tenderness  Lungs:     Clear to auscultation bilaterally, without wheezes, rhonchi or rales  Chest wall:    No tenderness to palpitation  Heart:    Regular rate and rhythm without murmurs, gallops, rubs  Abdomen:     Soft, non-tender, nondistended, normal bowel sounds, no organomegaly  Genitalia:     deferred  Rectal:    deferred  Extremities:   No clubbing, cyanosis or edema.  Pulses:   2+ and symmetric all extremities  Skin:   Skin color, texture, turgor normal, no rashes or lesions  Lymph nodes:   Cervical, supraclavicular, and axillary nodes normal  Neurologic:   CNII-XII intact. Normal strength, sensation and reflexes      throughout    Labs on Admission:  Basic Metabolic Panel:  Recent Labs Lab 11/27/15 0245  NA 140  K 4.1  CL 103  CO2 27  GLUCOSE 78  BUN 12  CREATININE 1.22  CALCIUM 10.3   Liver Function Tests: No results for input(s): AST, ALT, ALKPHOS, BILITOT, PROT, ALBUMIN in the last 168 hours. No results for input(s): LIPASE, AMYLASE in the last 168 hours. No results for input(s): AMMONIA in the last 168 hours. CBC:  Recent Labs Lab 11/27/15 0245  WBC 8.6  NEUTROABS 5.8  HGB 14.9  HCT 45.7  MCV 92.9  PLT 290   Cardiac Enzymes: No results for input(s): CKTOTAL, CKMB, CKMBINDEX, TROPONINI in the last 168 hours.  BNP (last 3 results) No results for input(s): PROBNP in the last 8760 hours. CBG: No results for input(s): GLUCAP in the last 168 hours.  Radiological Exams on Admission: Dg Chest 2 View  11/27/2015  CLINICAL DATA:  Right-sided chest pain with  inspiration. No mention of trauma. EXAM: CHEST  2 VIEW COMPARISON:  07/11/2010 FINDINGS: There is an indistinct peripheral opacity at the right base with small pleural effusion seen laterally. No convincing left-sided lung opacity. Normal heart size and aortic contours. IMPRESSION: 1. Small opacity at the right base, nonspecific with this clinical history. Atelectasis, pneumonia, or lung infarct could have this appearance. 2. Trace right pleural effusion. Electronically Signed   By: Marnee Spring M.D.   On: 11/27/2015 01:51   Ct Angio Chest Pe W/cm &/or Wo Cm  11/27/2015  CLINICAL DATA:  Right-sided chest pain EXAM: CT ANGIOGRAPHY CHEST WITH CONTRAST TECHNIQUE: Multidetector CT imaging of the  chest was performed using the standard protocol during bolus administration of intravenous contrast. Multiplanar CT image reconstructions and MIPs were obtained to evaluate the vascular anatomy. CONTRAST:  OMNIPAQUE IOHEXOL 350 MG/ML SOLN COMPARISON:  None. FINDINGS: THORACIC INLET/BODY WALL: No acute abnormality. MEDIASTINUM: There is multi focal central, branch point pulmonary artery filling defects consistent with acute emboli. These are segmental to subsegmental and affect the lateral segment right middle lobe and basilar segments right lower lobe. Although distorted by motion, there is also an anterior basilar segment subsegmental embolus on the left. There is no evidence of right heart strain (RV to LV ratio is less than 0.9) but there is typical infarct changes in the anterior basilar segment right lower lobe. Atelectatic opacity in the left lower lobe, likely also ischemic. Small right pleural effusion. Prominent size of right hilar lymph nodes, nonspecific in this setting. LUNG WINDOWS: Pulmonary findings described above. UPPER ABDOMEN: No acute findings. OSSEOUS: No acute fracture.  No suspicious lytic or blastic lesions. Critical Value/emergent results were called by telephone at the time of interpretation on 11/27/2015 at 4:24 am to Dr. Jaci Carrel , who verbally acknowledged these results. Review of the MIP images confirms the above findings. 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 No indication of right heart strain. Electronically Signed   By: Marnee Spring M.D.   On: 11/27/2015 04:27    EKG: Independently reviewed.  Assessment/Plan Active Problems:   PE (pulmonary embolism)   1. PE - 1. Xarelto per pharm 2. Pain control with morphine 3. [place] BLE 4. hypercoagulable pnl ordered    Code Status: Full  Family Communication: No family in room Disposition Plan: Admit to obs   Time spent: 30 min  GARDNER, JARED M. [facility] Pager [phone]  If 7AM-7PM, please contact the day team taking care of the patient [email or web address] Password [email or web address] 11/27/2015, 4:59 AM

## 2015-11-27 NOTE — ED Notes (Signed)
Pt is ambulatory. Pt states that since yesterday he has had rib cage pain on his right side. It hurts when he moves his right arm. Pt states the pain is worse when he takes a deep breath. Pt states "it feels like muscular pain, like I strained it".

## 2015-11-28 DIAGNOSIS — Z720 Tobacco use: Secondary | ICD-10-CM

## 2015-11-28 DIAGNOSIS — R0781 Pleurodynia: Secondary | ICD-10-CM | POA: Insufficient documentation

## 2015-11-28 LAB — CBC
HEMATOCRIT: 31 % — AB (ref 39.0–52.0)
HEMOGLOBIN: 10.3 g/dL — AB (ref 13.0–17.0)
MCH: 30.6 pg (ref 26.0–34.0)
MCHC: 33.2 g/dL (ref 30.0–36.0)
MCV: 92 fL (ref 78.0–100.0)
Platelets: 230 10*3/uL (ref 150–400)
RBC: 3.37 MIL/uL — ABNORMAL LOW (ref 4.22–5.81)
RDW: 13.7 % (ref 11.5–15.5)
WBC: 5.9 10*3/uL (ref 4.0–10.5)

## 2015-11-28 LAB — COMPREHENSIVE METABOLIC PANEL
ALBUMIN: 3 g/dL — AB (ref 3.5–5.0)
ALK PHOS: 68 U/L (ref 38–126)
ALT: 15 U/L — ABNORMAL LOW (ref 17–63)
ANION GAP: 7 (ref 5–15)
AST: 16 U/L (ref 15–41)
BILIRUBIN TOTAL: 0.4 mg/dL (ref 0.3–1.2)
BUN: 12 mg/dL (ref 6–20)
CALCIUM: 8.7 mg/dL — AB (ref 8.9–10.3)
CO2: 27 mmol/L (ref 22–32)
CREATININE: 1.15 mg/dL (ref 0.61–1.24)
Chloride: 106 mmol/L (ref 101–111)
GFR calc Af Amer: >60 mL/min (ref >60)
GFR calc non Af Amer: >60 mL/min (ref >60)
GLUCOSE: 112 mg/dL — AB (ref 65–99)
Potassium: 4.1 mmol/L (ref 3.5–5.1)
Sodium: 140 mmol/L (ref 135–145)
TOTAL PROTEIN: 5.8 g/dL — AB (ref 6.5–8.1)

## 2015-11-28 LAB — HOMOCYSTEINE: Homocysteine: <3 umol/L (ref 0.0–15.0)

## 2015-11-28 MED ORDER — RIVAROXABAN 20 MG PO TABS: 20.0000 mg | ORAL_TABLET | Freq: Every day | ORAL | Status: DC

## 2015-11-28 MED ORDER — OXYCODONE HCL 5 MG PO TABS: 5.0000 mg | ORAL_TABLET | Freq: Four times a day (QID) | ORAL | Status: DC | PRN

## 2015-11-28 MED ORDER — RIVAROXABAN 15 MG PO TABS
15.0000 mg | ORAL_TABLET | Freq: Two times a day (BID) | ORAL | Status: DC
Start: 2015-11-28 — End: 2016-01-02

## 2015-11-28 MED ORDER — METHOCARBAMOL 500 MG PO TABS: 500.0000 mg | ORAL_TABLET | Freq: Three times a day (TID) | ORAL | Status: DC | PRN

## 2015-11-28 NOTE — Progress Notes (Signed)
Patient d/c at this time, in stable condition.

## 2015-11-28 NOTE — Discharge Summary (Signed)
Physician Discharge Summary  Craig Daugherty [id number] DOB: 1981-02-05 DOA: 11/27/2015  PCP: Default, Provider, MD  Admit date: 11/27/2015 Discharge date: 11/28/2015  Time spent: 35 minutes  Recommendations for Outpatient Follow-up:  1. Please repeat CBC to follow Hgb and platelets level 2. Repeat BMET to follow electrolytes and renal function    Discharge Diagnoses:  Active Problems:   PE (pulmonary embolism) Pleuritic CP Tobacco abuse   Discharge Condition: stable and improved. Discharge home with appointment to establish care with PCP and to have hospital follow up.  Diet recommendation: regular diet   Filed Weights   11/27/15 0531 11/27/15 0611  Weight: 104.327 kg (230 lb) 101.742 kg (224 lb 4.8 oz)    History of present illness:  34 y.o. male with PMH for tobacco abuse; who presented to the ED with c/o R sided chest and rib cage pain. 10/10 in severity. Symptoms onset 1-2 days prior to admission and worsening. Pain Worse with movement and deep inspiration. No h/o similar symptoms previously. No h/o blood clots, no family history of blood clots, no history of travel. Found to have PE on work up.  Hospital Course:  1-PE: -unclear etiology at present  -patient will be treated with xarelto -anticipated therapy for 6 months -Le duplex neg -coagulation panel pending at discharge -will be discharge with flutter valve and PRN pain meds  2-pleuritic CP: due to PE -will treat with PRN pain meds  3-tobacco abuse: cessation counseling provided -patient declines nicotine patch; its looking to quit.  Procedures:  See below for x-ray reports   LE duplex: neg for DVT  Consultations:  None   Discharge Exam: Filed Vitals:   11/27/15 2130 11/28/15 0537  BP: 111/57 101/58  Pulse: 67 64  Temp: 98.7 F (37.1 C) 98.1 F (36.7 C)  Resp: 16 16    General: afebrile, feeling better and breathing easier. Good O sat on RA Cardiovascular: S1 and S2, no rubs or  gallops Respiratory: no wheezing, no crackles; patient with shallow breathing and discomfort on rights side with deep breaths Abd: soft, NT, ND, positive BS Neuro: non focal   Discharge Instructions   Discharge Instructions    Discharge instructions    Complete by:  As directed   Take medications as prescribed Please avoid the use of NSAID's Ok to use Tylenol as needed for pain/breakthrough  Watch urine and feces for any potential bleeding Use Flutter valve as instructed and advance activity gradually  Maintain adequate hydration [facility] as instructed for hospital follow up and establishing care with PCP          Current Discharge Medication List    START taking these medications   Details  methocarbamol (ROBAXIN) 500 MG tablet Take 1 tablet (500 mg total) by mouth every 8 (eight) hours as needed (muscla spams and muscle discomfort). Qty: 30 tablet, Refills: 0    oxyCODONE (OXY IR/ROXICODONE) 5 MG immediate release tablet Take 1-2 tablets (5-10 mg total) by mouth every 6 (six) hours as needed for severe pain. Qty: 40 tablet, Refills: 0    !! Rivaroxaban (XARELTO) 15 MG TABS tablet Take 1 tablet (15 mg total) by mouth 2 (two) times daily with a meal. Qty: 40 tablet, Refills: 0    !! rivaroxaban (XARELTO) 20 MG TABS tablet Take 1 tablet (20 mg total) by mouth daily with supper. Qty: 30 tablet, Refills: 5     !! - Potential duplicate medications found. Please discuss with provider.  No Known Allergies Follow-up Information    Follow up with [facility] On 12/18/2015.   Why:  In the [facility] next to [facility]. Follow up appointment. Appointment at 1:45pm. Please bring $20 copay, Photo ID and all medications and prescriptions.   Contact information:   [address] [place] [address]       The results of significant diagnostics from this hospitalization (including imaging,  microbiology, ancillary and laboratory) are listed below for reference.    Significant Diagnostic Studies: Dg Chest 2 View  11/27/2015  CLINICAL DATA:  Right-sided chest pain with inspiration. No mention of trauma. EXAM: CHEST  2 VIEW COMPARISON:  07/11/2010 FINDINGS: There is an indistinct peripheral opacity at the right base with small pleural effusion seen laterally. No convincing left-sided lung opacity. Normal heart size and aortic contours. IMPRESSION: 1. Small opacity at the right base, nonspecific with this clinical history. Atelectasis, pneumonia, or lung infarct could have this appearance. 2. Trace right pleural effusion. Electronically Signed   By: Marnee Spring M.D.   On: 11/27/2015 01:51   Ct Angio Chest Pe W/cm &/or Wo Cm  11/27/2015  CLINICAL DATA:  Right-sided chest pain EXAM: CT ANGIOGRAPHY CHEST WITH CONTRAST TECHNIQUE: Multidetector CT imaging of the chest was performed using the standard protocol during bolus administration of intravenous contrast. Multiplanar CT image reconstructions and MIPs were obtained to evaluate the vascular anatomy. CONTRAST:  OMNIPAQUE IOHEXOL 350 MG/ML SOLN COMPARISON:  None. FINDINGS: THORACIC INLET/BODY WALL: No acute abnormality. MEDIASTINUM: There is multi focal central, branch point pulmonary artery filling defects consistent with acute emboli. These are segmental to subsegmental and affect the lateral segment right middle lobe and basilar segments right lower lobe. Although distorted by motion, there is also an anterior basilar segment subsegmental embolus on the left. There is no evidence of right heart strain (RV to LV ratio is less than 0.9) but there is typical infarct changes in the anterior basilar segment right lower lobe. Atelectatic opacity in the left lower lobe, likely also ischemic. Small right pleural effusion. Prominent size of right hilar lymph nodes, nonspecific in this setting. LUNG WINDOWS: Pulmonary findings described above.  UPPER ABDOMEN: No acute findings. OSSEOUS: No acute fracture.  No suspicious lytic or blastic lesions. Critical Value/emergent results were called by telephone at the time of interpretation on 11/27/2015 at 4:24 am to Dr. Jaci Carrel , who verbally acknowledged these results. Review of the MIP images confirms the above findings. 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 No indication of right heart strain. Electronically Signed   By: Marnee Spring M.D.   On: 11/27/2015 04:27   Labs: Basic Metabolic Panel:  Recent Labs Lab 11/27/15 0245 11/28/15 0416  NA 140 140  K 4.1 4.1  CL 103 106  CO2 27 27  GLUCOSE 78 112*  BUN 12 12  CREATININE 1.22 1.15  CALCIUM 10.3 8.7*   Liver Function Tests:  Recent Labs Lab 11/28/15 0416  AST 16  ALT 15*  ALKPHOS 68  BILITOT 0.4  PROT 5.8*  ALBUMIN 3.0*   CBC:  Recent Labs Lab 11/27/15 0245 11/28/15 0416  WBC 8.6 5.9  NEUTROABS 5.8  --   HGB 14.9 10.3*  HCT 45.7 31.0*  MCV 92.9 92.0  PLT 290 230     Signed:  Vassie Loll  [facility] 11/28/2015, 10:46 AM

## 2015-11-28 NOTE — Progress Notes (Signed)
Discharge instructions given,prescriptions handed to patient. I discussed on how to take the Xarelto, he understood instructions, teach back utilized. Patient is stable,no signs of bleeding.

## 2015-11-29 LAB — CARDIOLIPIN ANTIBODIES, IGG, IGM, IGA: Anticardiolipin IgA: <9 APL U/mL (ref 0–11)

## 2015-11-29 LAB — BETA-2-GLYCOPROTEIN I ABS, IGG/M/A
Beta-2-Glycoprotein I IgA: <9 GPI IgA units (ref 0–25)
Beta-2-Glycoprotein I IgM: <9 GPI IgM units (ref 0–32)

## 2015-11-29 LAB — LUPUS ANTICOAGULANT PANEL
DRVVT: 40.8 s (ref 0.0–44.0)
PTT LA: 31.7 s (ref 0.0–40.6)

## 2015-11-29 LAB — PROTEIN C ACTIVITY: Protein C Activity: 112 % (ref 73–180)

## 2015-11-29 LAB — PROTEIN S, TOTAL: PROTEIN S AG TOTAL: 124 % (ref 60–150)

## 2015-11-29 LAB — PROTEIN C, TOTAL: Protein C, Total: 81 % (ref 60–150)

## 2015-11-29 LAB — PROTEIN S ACTIVITY: PROTEIN S ACTIVITY: 93 % (ref 63–140)

## 2015-12-01 LAB — FACTOR 5 LEIDEN

## 2015-12-05 LAB — PROTHROMBIN GENE MUTATION

## 2015-12-15 ENCOUNTER — Emergency Department (HOSPITAL_COMMUNITY): Payer: Self-pay

## 2015-12-15 ENCOUNTER — Emergency Department (HOSPITAL_COMMUNITY)
Admission: EM | Admit: 2015-12-15 | Discharge: 2015-12-15 | Disposition: A | Discharge status: Home / Self Care | Payer: Self-pay | Attending: Emergency Medicine | Admitting: Emergency Medicine

## 2015-12-15 ENCOUNTER — Encounter (HOSPITAL_COMMUNITY): Payer: Self-pay

## 2015-12-15 DIAGNOSIS — Z7901 Long term (current) use of anticoagulants: Secondary | ICD-10-CM | POA: Insufficient documentation

## 2015-12-15 DIAGNOSIS — J069 Acute upper respiratory infection, unspecified: Secondary | ICD-10-CM | POA: Insufficient documentation

## 2015-12-15 DIAGNOSIS — Z86711 Personal history of pulmonary embolism: Secondary | ICD-10-CM | POA: Insufficient documentation

## 2015-12-15 DIAGNOSIS — F17200 Nicotine dependence, unspecified, uncomplicated: Secondary | ICD-10-CM | POA: Insufficient documentation

## 2015-12-15 HISTORY — DX: Other pulmonary embolism without acute cor pulmonale: I26.99

## 2015-12-15 LAB — CBC WITH DIFFERENTIAL/PLATELET
Basophils Absolute: 0 10*3/uL (ref 0.0–0.1)
Basophils Relative: 0 %
EOS ABS: 0.4 10*3/uL (ref 0.0–0.7)
EOS PCT: 5 %
HCT: 33.7 % — ABNORMAL LOW (ref 39.0–52.0)
HEMOGLOBIN: 10.8 g/dL — AB (ref 13.0–17.0)
LYMPHS ABS: 1.5 10*3/uL (ref 0.7–4.0)
Lymphocytes Relative: 21 %
MCH: 29.6 pg (ref 26.0–34.0)
MCHC: 32 g/dL (ref 30.0–36.0)
MCV: 92.3 fL (ref 78.0–100.0)
MONO ABS: 1.5 10*3/uL — AB (ref 0.1–1.0)
MONOS PCT: 21 %
Neutro Abs: 3.7 10*3/uL (ref 1.7–7.7)
Neutrophils Relative %: 53 %
PLATELETS: 296 10*3/uL (ref 150–400)
RBC: 3.65 MIL/uL — ABNORMAL LOW (ref 4.22–5.81)
RDW: 13.7 % (ref 11.5–15.5)
WBC: 7.1 10*3/uL (ref 4.0–10.5)

## 2015-12-15 LAB — I-STAT TROPONIN, ED: TROPONIN I, POC: 0 ng/mL (ref 0.00–0.08)

## 2015-12-15 LAB — RAPID STREP SCREEN (MED CTR MEBANE ONLY): Streptococcus, Group A Screen (Direct): NEGATIVE

## 2015-12-15 LAB — BASIC METABOLIC PANEL
Anion gap: 7 (ref 5–15)
BUN: 18 mg/dL (ref 6–20)
CHLORIDE: 105 mmol/L (ref 101–111)
CO2: 28 mmol/L (ref 22–32)
CREATININE: 1.05 mg/dL (ref 0.61–1.24)
Calcium: 9.5 mg/dL (ref 8.9–10.3)
GFR calc Af Amer: >60 mL/min (ref >60)
GFR calc non Af Amer: >60 mL/min (ref >60)
GLUCOSE: 98 mg/dL (ref 65–99)
Potassium: 4.7 mmol/L (ref 3.5–5.1)
Sodium: 140 mmol/L (ref 135–145)

## 2015-12-15 MED ORDER — IOHEXOL 350 MG/ML SOLN
100.0000 mL | Freq: Once | INTRAVENOUS | Status: AC | PRN
  Administered 2015-12-15: 100 mL via INTRAVENOUS

## 2015-12-15 MED ORDER — SODIUM CHLORIDE 0.9 % IV BOLUS (SEPSIS)
1000.0000 mL | Freq: Once | INTRAVENOUS | Status: AC
  Administered 2015-12-15: 1000 mL via INTRAVENOUS

## 2015-12-15 NOTE — ED Provider Notes (Signed)
CSN: [id number]     Arrival date & time 12/15/15  0804 History   First MD Initiated Contact with Patient 12/15/15 0809     Chief Complaint  Patient presents with  . Shortness of Breath     (Consider location/radiation/quality/duration/timing/severity/associated sxs/prior Treatment) HPI  35 year old male with a recent diagnosis of PE presents with sore throat, congestion, as well as shortness of breath and dizziness. Patient states the dizziness has been going on intermittently for about one week. He's been feeling more short of breath intermittently over the last 2 days in association with the sore throat and congestion. He is also having chest pain just left of the sternum over these 2 days. Has had no fevers but has had chills. Sore throat is on the right side. When asking patient about his Xarelto use he states he is taking it once a day instead of twice a day because when he takes it twice a day he feels palpitations. Patient states his chest pain is kind of similar but also somewhat different than when he was diagnosed with a PE.   Past Medical History  Diagnosis Date  . Pulmonary embolism (HCC)    History reviewed. No pertinent past surgical history. History reviewed. No pertinent family history. Social History  Substance Use Topics  . Smoking status: Current Every Day Smoker  . Smokeless tobacco: None  . Alcohol Use: Yes     Comment: OCCASIONAL    Review of Systems  Constitutional: Positive for chills. Negative for fever.  HENT: Positive for congestion.   Respiratory: Positive for shortness of breath.   Cardiovascular: Positive for chest pain.  All other systems reviewed and are negative.     Allergies  Review of patient's allergies indicates no known allergies.  Home Medications   Prior to Admission medications   Medication Sig Start Date End Date Taking? Authorizing Provider  methocarbamol (ROBAXIN) 500 MG tablet Take 1 tablet (500 mg total) by mouth every 8  (eight) hours as needed (muscla spams and muscle discomfort). 11/28/15  Yes Vassie Loll, MD  oxyCODONE (OXY IR/ROXICODONE) 5 MG immediate release tablet Take 1-2 tablets (5-10 mg total) by mouth every 6 (six) hours as needed for severe pain. 11/28/15  Yes Vassie Loll, MD  Rivaroxaban (XARELTO) 15 MG TABS tablet Take 1 tablet (15 mg total) by mouth 2 (two) times daily with a meal. Patient taking differently: Take 15 mg by mouth daily.  11/28/15 12/15/15 Yes Vassie Loll, MD  rivaroxaban (XARELTO) 20 MG TABS tablet Take 1 tablet (20 mg total) by mouth daily with supper. 12/19/15   Vassie Loll, MD   BP 128/70 mmHg  Pulse 75  Temp(Src) 97.9 F (36.6 C) (Oral)  Resp 18  SpO2 100% Physical Exam  Constitutional: He is oriented to person, place, and time. He appears well-developed and well-nourished. No distress.  HENT:  Head: Normocephalic and atraumatic.  Right Ear: External ear normal.  Left Ear: External ear normal.  Nose: Nose normal.  Mouth/Throat: Uvula is midline. No trismus in the jaw. No uvula swelling. No oropharyngeal exudate or tonsillar abscesses.  Eyes: Right eye exhibits no discharge. Left eye exhibits no discharge.  Neck: Neck supple.  Cardiovascular: Normal rate, regular rhythm, normal heart sounds and intact distal pulses.   Pulmonary/Chest: Effort normal and breath sounds normal. He has no wheezes. He has no rales. He exhibits tenderness.    Abdominal: Soft. There is no tenderness.  Musculoskeletal: He exhibits no edema.  Lymphadenopathy:    He  has cervical adenopathy (right).  Neurological: He is alert and oriented to person, place, and time.  Skin: Skin is warm and dry. He is not diaphoretic.  Nursing note and vitals reviewed.   ED Course  Procedures (including critical care time) Labs Review Labs Reviewed  CBC WITH DIFFERENTIAL/PLATELET - Abnormal; Notable for the following:    RBC 3.65 (*)    Hemoglobin 10.8 (*)    HCT 33.7 (*)    Monocytes Absolute 1.5  (*)    All other components within normal limits  RAPID STREP SCREEN (NOT AT [facility])  CULTURE, GROUP A STREP  BASIC METABOLIC PANEL  I-STAT TROPOININ, ED    Imaging Review Dg Chest 2 View  12/15/2015  CLINICAL DATA:  No onset of cough, congestion and chest pain, 2 days duration. Recent pulmonary emboli. EXAM: CHEST  2 VIEW COMPARISON:  11/27/2015 FINDINGS: Heart size is normal. Mediastinal shadows are normal. The lungs are clear. No bronchial thickening. No infiltrate, mass, effusion or collapse. Pulmonary vascularity is normal. No bony abnormality. IMPRESSION: Normal chest Electronically Signed   By: Paulina FusiMark  Shogry M.D.   On: 12/15/2015 08:37   Ct Angio Chest Pe W/cm &/or Wo Cm  12/15/2015  CLINICAL DATA:  Recent PE, dizziness, sore throat, shortness of breath for 2 days EXAM: CT ANGIOGRAPHY CHEST WITH CONTRAST TECHNIQUE: Multidetector CT imaging of the chest was performed using the standard protocol during bolus administration of intravenous contrast. Multiplanar CT image reconstructions and MIPs were obtained to evaluate the vascular anatomy. CONTRAST:  100mL OMNIPAQUE IOHEXOL 350 MG/ML SOLN COMPARISON:  11/27/2015 FINDINGS: Images of the thoracic inlet are unremarkable. Central airways are patent. The study is of good technical quality. There is no evidence of pulmonary embolus. Heart size within normal limits. No pericardial effusion. Sagittal images of the spine are unremarkable. Sagittal view of the sternum is unremarkable. There is no mediastinal hematoma or adenopathy. No significant hilar adenopathy. Central thoracic aorta is unremarkable. The visualized upper abdomen is unremarkable. There is focal nodular residual atelectasis, infiltrate or lung infarct in right middle lobe laterally best seen axial image 73 measures about 2 cm. This is just above the right lateral hemidiaphragm. I would suggest a follow-up CT scan in 3 months to assure resolution or stability. No segmental infiltrate or pulmonary  edema. No pneumothorax. Review of the MIP images confirms the above findings. IMPRESSION: 1. No pulmonary embolus. 2. There is focal nodular consolidation in right middle lobe laterally. Measures about 2 cm best seen in axial image 73. This may represent residual atelectasis, infiltrate or lung infarct. Although there is improvement from prior exam given history of tobacco abuse I would recommend a follow-up CT scan in 3 months to assure stability or resolution. 3. No mediastinal hematoma or adenopathy. 4. No segmental infiltrate or pulmonary edema. Electronically Signed   By: Natasha MeadLiviu  Pop M.D.   On: 12/15/2015 10:43   I have personally reviewed and evaluated these images and lab results as part of my medical decision-making.   EKG Interpretation   Date/Time:  Friday December 15 2015 09:07:16 EST Ventricular Rate:  60 PR Interval:  149 QRS Duration: 86 QT Interval:  380 QTC Calculation: 380 R Axis:   42 Text Interpretation:  Sinus rhythm ST elevation suggests acute  pericarditis no significant change since July 2011 Confirmed by Criss AlvineGOLDSTON   MD, Laray Rivkin ([id number]) on 12/15/2015 9:13:05 AM      MDM   Final diagnoses:  Viral upper respiratory infection    Patient symptoms  are most consistent with a viral upper respiratory infection. Strep screen is negative. Mild lymphadenopathy that is likely reactive. On exam he appears quite well and his vital signs are unremarkable. However given his recent PE diagnosis, misuse of his Xarelto, and chest pain, a CT was obtained again and shows no new PE. Shows what was likely a resolving pulmonary infarction but no new pneumonia. CT indicates this could be an atypical infiltrative and that it appears improved from prior CT is highly doubt pneumonia. He has no cough or fever. WBC is normal. Will treat as a viral syndrome and recommend taking Xarelto as prescribed and keeping follow-up with PCP.    Pricilla Loveless, MD 12/15/15 1126

## 2015-12-15 NOTE — Discharge Instructions (Signed)
It is very important that you take the Xarelto as prescribed. If not, you are at risk for having another blood clot in your lungs.

## 2015-12-15 NOTE — ED Notes (Signed)
Pt dx recently with PE.  Taking xarelto at home.  In last 2 days, pt has had increased shortness of breath, dizziness with standing and sore throat.  Unknown for fever at home.  Pt denies n/v.

## 2015-12-18 ENCOUNTER — Ambulatory Visit: Payer: Self-pay | Admitting: Family Medicine

## 2015-12-18 LAB — CULTURE, GROUP A STREP

## 2015-12-31 ENCOUNTER — Emergency Department (HOSPITAL_COMMUNITY)
Admission: EM | Admit: 2015-12-31 | Discharge: 2015-12-31 | Disposition: A | Discharge status: Home / Self Care | Payer: Self-pay | Attending: Emergency Medicine | Admitting: Emergency Medicine

## 2015-12-31 ENCOUNTER — Encounter (HOSPITAL_COMMUNITY): Payer: Self-pay | Admitting: *Deleted

## 2015-12-31 ENCOUNTER — Emergency Department (HOSPITAL_COMMUNITY): Payer: Self-pay

## 2015-12-31 DIAGNOSIS — J069 Acute upper respiratory infection, unspecified: Secondary | ICD-10-CM | POA: Insufficient documentation

## 2015-12-31 DIAGNOSIS — Z86711 Personal history of pulmonary embolism: Secondary | ICD-10-CM | POA: Insufficient documentation

## 2015-12-31 DIAGNOSIS — R042 Hemoptysis: Secondary | ICD-10-CM | POA: Insufficient documentation

## 2015-12-31 DIAGNOSIS — Z7901 Long term (current) use of anticoagulants: Secondary | ICD-10-CM | POA: Insufficient documentation

## 2015-12-31 DIAGNOSIS — F17200 Nicotine dependence, unspecified, uncomplicated: Secondary | ICD-10-CM | POA: Insufficient documentation

## 2015-12-31 LAB — COMPREHENSIVE METABOLIC PANEL
ALBUMIN: 3.5 g/dL (ref 3.5–5.0)
ALT: 17 U/L (ref 17–63)
ANION GAP: 8 (ref 5–15)
AST: 23 U/L (ref 15–41)
Alkaline Phosphatase: 81 U/L (ref 38–126)
BUN: 17 mg/dL (ref 6–20)
CHLORIDE: 107 mmol/L (ref 101–111)
CO2: 27 mmol/L (ref 22–32)
Calcium: 9.5 mg/dL (ref 8.9–10.3)
Creatinine, Ser: 1.24 mg/dL (ref 0.61–1.24)
GFR calc Af Amer: >60 mL/min (ref >60)
GFR calc non Af Amer: >60 mL/min (ref >60)
GLUCOSE: 89 mg/dL (ref 65–99)
POTASSIUM: 4 mmol/L (ref 3.5–5.1)
Sodium: 142 mmol/L (ref 135–145)
TOTAL PROTEIN: 7.1 g/dL (ref 6.5–8.1)
Total Bilirubin: 0.3 mg/dL (ref 0.3–1.2)

## 2015-12-31 LAB — CBC WITH DIFFERENTIAL/PLATELET
Basophils Absolute: 0 K/uL (ref 0.0–0.1)
Basophils Relative: 1 %
Eosinophils Absolute: 0.4 K/uL (ref 0.0–0.7)
Eosinophils Relative: 7 %
HCT: 33.3 % — ABNORMAL LOW (ref 39.0–52.0)
Hemoglobin: 10.6 g/dL — ABNORMAL LOW (ref 13.0–17.0)
Lymphocytes Relative: 31 %
Lymphs Abs: 1.8 K/uL (ref 0.7–4.0)
MCH: 28.4 pg (ref 26.0–34.0)
MCHC: 31.8 g/dL (ref 30.0–36.0)
MCV: 89.3 fL (ref 78.0–100.0)
Monocytes Absolute: 0.7 K/uL (ref 0.1–1.0)
Monocytes Relative: 12 %
Neutro Abs: 2.9 K/uL (ref 1.7–7.7)
Neutrophils Relative %: 49 %
Platelets: 284 K/uL (ref 150–400)
RBC: 3.73 MIL/uL — ABNORMAL LOW (ref 4.22–5.81)
RDW: 13.6 % (ref 11.5–15.5)
WBC: 5.9 K/uL (ref 4.0–10.5)

## 2015-12-31 MED ORDER — GUAIFENESIN-DM 100-10 MG/5ML PO SYRP: 5.0000 mL | ORAL_SOLUTION | Freq: Three times a day (TID) | ORAL | Status: DC | PRN

## 2015-12-31 MED ORDER — GUAIFENESIN-DM 100-10 MG/5ML PO SYRP
5.0000 mL | ORAL_SOLUTION | ORAL | Status: DC | PRN
  Administered 2015-12-31: 5 mL via ORAL
  Filled 2015-12-31: qty 10

## 2015-12-31 NOTE — ED Notes (Signed)
Patient transported to X-ray 

## 2015-12-31 NOTE — Discharge Instructions (Signed)
Hemoptysis Hemoptysis, which means coughing up blood, can be a sign of a minor problem or a serious medical condition. The blood that is coughed up may come from the lungs and airways. Coughed-up blood can also come from bleeding that occurs outside the lungs and airways. Blood can drain into the windpipe during a severe nosebleed or when blood is vomited from the stomach. Because hemoptysis can be a sign of something serious, a medical evaluation is required. For some people with hemoptysis, no definite cause is ever identified. CAUSES  The most common cause of hemoptysis is bronchitis. Some other common causes include:   A ruptured blood vessel caused by coughing or an infection.   A medical condition that causes damage to the large air passageways (bronchiectasis).   A blood clot in the lungs (pulmonary embolism).   Pneumonia.   Tuberculosis.   Breathing in a small foreign object.   Cancer. For some people with hemoptysis, no definite cause is ever identified.  HOME CARE INSTRUCTIONS  Only take over-the-counter or prescription medicines as directed by your caregiver. Do not use cough suppressants unless your caregiver approves.  If your caregiver prescribes antibiotic medicines, take them as directed. Finish them even if you start to feel better.  Do not smoke. Also avoid secondhand smoke.  Follow up with your caregiver as directed. SEEK IMMEDIATE MEDICAL CARE IF:   You cough up bloody mucus for longer than a week.  You have a blood-producing cough that is severe or getting worse.  You have a blood-producing cough thatcomes and goes over time.  You develop problems with your breathing.   You vomit blood.  You develop bloody or black-colored stools.  You have chest pain.   You develop night sweats.  You feel faint or pass out.   You have a fever or persistent symptoms for more than 2-3 days.  You have a fever and your symptoms suddenly get worse. MAKE  SURE YOU:  Understand these instructions.  Will watch your condition.  Will get help right away if you are not doing well or get worse.   This information is not intended to replace advice given to you by your health care provider. Make sure you discuss any questions you have with your health care provider.   Document Released: 02/03/2002 Document Revised: 11/11/2012 Document Reviewed: 09/11/2012 Elsevier Interactive Patient Education 2016 [employer]  Upper Respiratory Infection, Adult Most upper respiratory infections (URIs) are a viral infection of the air passages leading to the lungs. A URI affects the nose, throat, and upper air passages. The most common type of URI is nasopharyngitis and is typically referred to as "the common cold." URIs run their course and usually go away on their own. Most of the time, a URI does not require medical attention, but sometimes a bacterial infection in the upper airways can follow a viral infection. This is called a secondary infection. Sinus and middle ear infections are common types of secondary upper respiratory infections. Bacterial pneumonia can also complicate a URI. A URI can worsen asthma and chronic obstructive pulmonary disease (COPD). Sometimes, these complications can require emergency medical care and may be life threatening.  CAUSES Almost all URIs are caused by viruses. A virus is a type of germ and can spread from one person to another.  RISKS FACTORS You may be at risk for a URI if:   You smoke.   You have chronic heart or lung disease.  You have a weakened defense (immune)  system.   You are very young or very old.   You have nasal allergies or asthma.  You work in crowded or poorly ventilated areas.  You work in health care facilities or schools. SIGNS AND SYMPTOMS  Symptoms typically develop 2-3 days after you come in contact with a cold virus. Most viral URIs last 7-10 days. However, viral URIs from the influenza  virus (flu virus) can last 14-18 days and are typically more severe. Symptoms may include:   Runny or stuffy (congested) nose.   Sneezing.   Cough.   Sore throat.   Headache.   Fatigue.   Fever.   Loss of appetite.   Pain in your forehead, behind your eyes, and over your cheekbones (sinus pain).  Muscle aches.  DIAGNOSIS  Your health care provider may diagnose a URI by:  Physical exam.  Tests to check that your symptoms are not due to another condition such as:  Strep throat.  Sinusitis.  Pneumonia.  Asthma. TREATMENT  A URI goes away on its own with time. It cannot be cured with medicines, but medicines may be prescribed or recommended to relieve symptoms. Medicines may help:  Reduce your fever.  Reduce your cough.  Relieve nasal congestion. HOME CARE INSTRUCTIONS   Take medicines only as directed by your health care provider.   Gargle warm saltwater or take cough drops to comfort your throat as directed by your health care provider.  Use a warm mist humidifier or inhale steam from a shower to increase air moisture. This may make it easier to breathe.  Drink enough fluid to keep your urine clear or pale yellow.   Eat soups and other clear broths and maintain good nutrition.   Rest as needed.   Return to work when your temperature has returned to normal or as your health care provider advises. You may need to stay home longer to avoid infecting others. You can also use a face mask and careful hand washing to prevent spread of the virus.  Increase the usage of your inhaler if you have asthma.   Do not use any tobacco products, including cigarettes, chewing tobacco, or electronic cigarettes. If you need help quitting, ask your health care provider. PREVENTION  The best way to protect yourself from getting a cold is to practice good hygiene.   Avoid oral or hand contact with people with cold symptoms.   Wash your hands often if contact  occurs.  There is no clear evidence that vitamin C, vitamin E, echinacea, or exercise reduces the chance of developing a cold. However, it is always recommended to get plenty of rest, exercise, and practice good nutrition.  SEEK MEDICAL CARE IF:   You are getting worse rather than better.   Your symptoms are not controlled by medicine.   You have chills.  You have worsening shortness of breath.  You have brown or red mucus.  You have yellow or brown nasal discharge.  You have pain in your face, especially when you bend forward.  You have a fever.  You have swollen neck glands.  You have pain while swallowing.  You have white areas in the back of your throat. SEEK IMMEDIATE MEDICAL CARE IF:   You have severe or persistent:  Headache.  Ear pain.  Sinus pain.  Chest pain.  You have chronic lung disease and any of the following:  Wheezing.  Prolonged cough.  Coughing up blood.  A change in your usual mucus.  You have  a stiff neck.  You have changes in your:  Vision.  Hearing.  Thinking.  Mood. MAKE SURE YOU:   Understand these instructions.  Will watch your condition.  Will get help right away if you are not doing well or get worse.   This information is not intended to replace advice given to you by your health care provider. Make sure you discuss any questions you have with your health care provider.   Document Released: 05/21/2001 Document Revised: 04/11/2015 Document Reviewed: 03/02/2014 Elsevier Interactive Patient Education [employer].

## 2015-12-31 NOTE — ED Provider Notes (Signed)
CSN: [id number]     Arrival date & time 12/31/15  4098 History   First MD Initiated Contact with Patient 12/31/15 [phone]     Chief Complaint  Patient presents with  . Hematemesis      The history is provided by the patient and a relative.   patient presents with hemoptysis. States he has been coughing and then at 2:00 this morning he coughed up some blood. States it was red and then became darker. No vomiting. No nosebleed. He is on Xarelto for previous pulmonary embolism. States he has been taking the medicine. No black stool. No lightheadedness or dizziness. States he does feel slightly short of breath, which is not unusual for him. He's had some nasal congestion.  Past Medical History  Diagnosis Date  . Pulmonary embolism (HCC)    History reviewed. No pertinent past surgical history. No family history on file. Social History  Substance Use Topics  . Smoking status: Current Every Day Smoker  . Smokeless tobacco: None  . Alcohol Use: Yes     Comment: OCCASIONAL    Review of Systems  Constitutional: Negative for appetite change and fatigue.  HENT: Positive for congestion. Negative for nosebleeds.   Respiratory: Positive for shortness of breath.   Cardiovascular: Negative for chest pain.  Gastrointestinal: Negative for abdominal pain and blood in stool.  Genitourinary: Negative for hematuria.  Musculoskeletal: Negative for back pain.  Neurological: Negative for weakness.  Hematological: Does not bruise/bleed easily.  Psychiatric/Behavioral: Negative for agitation.      Allergies  Review of patient's allergies indicates no known allergies.  Home Medications   Prior to Admission medications   Medication Sig Start Date End Date Taking? Authorizing Provider  methocarbamol (ROBAXIN) 500 MG tablet Take 1 tablet (500 mg total) by mouth every 8 (eight) hours as needed (muscla spams and muscle discomfort). 11/28/15  Yes Vassie Loll, MD  rivaroxaban (XARELTO) 20 MG TABS tablet  Take 1 tablet (20 mg total) by mouth daily with supper. 12/19/15  Yes Vassie Loll, MD  guaiFENesin-dextromethorphan [facility] DM) 100-10 MG/5ML syrup Take 5 mLs by mouth 3 (three) times daily as needed for cough. 12/31/15   Benjiman Core, MD  oxyCODONE (OXY IR/ROXICODONE) 5 MG immediate release tablet Take 1-2 tablets (5-10 mg total) by mouth every 6 (six) hours as needed for severe pain. Patient not taking: Reported on 12/31/2015 11/28/15   Vassie Loll, MD  Rivaroxaban (XARELTO) 15 MG TABS tablet Take 1 tablet (15 mg total) by mouth 2 (two) times daily with a meal. Patient not taking: Reported on 12/31/2015 11/28/15 12/15/15  Vassie Loll, MD   BP 112/84 mmHg  Pulse 65  Temp(Src) 98 F (36.7 C) (Oral)  Resp 15  SpO2 100% Physical Exam  Constitutional: He appears well-developed.  HENT:  Mouth/Throat: No oropharyngeal exudate.  Somewhat swollen nasal mucosa without blood  Neck: Neck supple.  Cardiovascular: Normal rate.   Pulmonary/Chest: Effort normal. No respiratory distress.  Abdominal: Soft.  Musculoskeletal: Normal range of motion.  Neurological: He is alert.  Skin: No pallor.    ED Course  Procedures (including critical care time) Labs Review Labs Reviewed  CBC WITH DIFFERENTIAL/PLATELET - Abnormal; Notable for the following:    RBC 3.73 (*)    Hemoglobin 10.6 (*)    HCT 33.3 (*)    All other components within normal limits  COMPREHENSIVE METABOLIC PANEL    Imaging Review Dg Chest 2 View  12/31/2015  CLINICAL DATA:  Productive cough, shortness of breath, weakness,  dizziness and intermittent fever. EXAM: CHEST - 2 VIEW COMPARISON:  12/15/2015 FINDINGS: The heart size and mediastinal contours are within normal limits. There is no evidence of pulmonary edema, consolidation, pneumothorax, nodule or pleural fluid. The visualized skeletal structures are unremarkable. IMPRESSION: No active disease. Electronically Signed   By: Irish Lack M.D.   On: 12/31/2015 08:38    I have personally reviewed and evaluated these images and lab results as part of my medical decision-making.   EKG Interpretation None      MDM   Final diagnoses:  URI (upper respiratory infection)  Hemoptysis    Patient with  Hemoptysis. Previous pulmonary embolism but has also had negative follow-up scan. He claims compliance with his medication. Has some signs and symptoms of UTI such as a cough with some sputum and nasal congestion. Likely this is not a pulmonary embolism in this is mild hemoptysis from the URI. Will discharge and has follow-up in 2 days.   Benjiman Core, MD 12/31/15 [phone]

## 2015-12-31 NOTE — ED Notes (Signed)
Pt requesting medication to help with pt's cough.  Dr. Rubin Payor made aware.

## 2015-12-31 NOTE — ED Notes (Signed)
Per pt report: pt woke up around 2am this morning and threw up "half a toilet bowl" of blood.  At first the blood was bright red but then became dark red.  Pt dx with a PE about a month and a half ago and was told to come back if he was throwing up blood.  Pt denies any pain or any other symptoms at this time.

## 2015-12-31 NOTE — ED Notes (Signed)
Bed: EA54 Expected date:  Expected time:  Means of arrival:  Comments: closed

## 2016-01-02 ENCOUNTER — Ambulatory Visit (INDEPENDENT_AMBULATORY_CARE_PROVIDER_SITE_OTHER): Payer: Self-pay | Admitting: Family Medicine

## 2016-01-02 ENCOUNTER — Encounter: Payer: Self-pay | Admitting: Family Medicine

## 2016-01-02 VITALS — BP 114/66 | HR 72 | Temp 97.9°F | Resp 18 | Ht >= 80 in | Wt 261.0 lb

## 2016-01-02 DIAGNOSIS — D649 Anemia, unspecified: Secondary | ICD-10-CM

## 2016-01-02 DIAGNOSIS — Z833 Family history of diabetes mellitus: Secondary | ICD-10-CM

## 2016-01-02 DIAGNOSIS — I2699 Other pulmonary embolism without acute cor pulmonale: Secondary | ICD-10-CM

## 2016-01-02 DIAGNOSIS — Z720 Tobacco use: Secondary | ICD-10-CM

## 2016-01-02 LAB — POCT URINALYSIS DIP (DEVICE)
BILIRUBIN URINE: NEGATIVE
Glucose, UA: NEGATIVE mg/dL
Hgb urine dipstick: NEGATIVE
Ketones, ur: NEGATIVE mg/dL
LEUKOCYTES UA: NEGATIVE
NITRITE: NEGATIVE
Protein, ur: NEGATIVE mg/dL
Specific Gravity, Urine: >=1.03 (ref 1.005–1.030)
UROBILINOGEN UA: 0.2 mg/dL (ref 0.0–1.0)
pH: 6 (ref 5.0–8.0)

## 2016-01-02 NOTE — Progress Notes (Signed)
Patient here to establish care  

## 2016-01-02 NOTE — Progress Notes (Signed)
Subjective:    Patient ID: Craig Daugherty, male    DOB: [birth year], 35 y.o.   MRN: [id number]  HPI  [name], a 35 year old male with a history of pulmonary embolism presents accompanied by mother to establish care. [name] states that he has not had a primary provider. He has mostly been using urgent care and the emergency department for all primary care needs. Patient was recently admitted to the hospital with right sided chest and rib cage pain. He was found to have a pulmonary embolism per CT/Angiogram. Patient was started on Xarelto for an unprovoked pulmonary embolism. Patient denies risk factors for a hypercoaguable state. [name] is a chronic everyday smoker. He states that he has reduced smoking to 2-3 cigarettes over the past month. He currently denies headache, chest pain, shortness of breath, fatigue, signs of bleeding, dysuria, nausea, vomiting, or diarrhea.   Past Medical History  Diagnosis Date  . Pulmonary embolism [facility])    Social History   Social History  . Marital Status: Single    Spouse Name: N/A  . Number of Children: N/A  . Years of Education: N/A   Occupational History  . Not on file.   Social History Main Topics  . Smoking status: Current Every Day Smoker  . Smokeless tobacco: Not on file  . Alcohol Use: Yes     Comment: OCCASIONAL  . Drug Use: Yes    Special: Marijuana  . Sexual Activity: Not on file   Other Topics Concern  . Not on file   Social History Narrative   History reviewed. No pertinent past surgical history.  Review of Systems  Constitutional: Positive for fatigue. Negative for fever and unexpected weight change.  HENT: Positive for congestion and sneezing. Negative for ear pain, mouth sores and sore throat.   Eyes: Negative.  Negative for photophobia and visual disturbance.  Respiratory: Negative.   Cardiovascular: Negative.  Negative for chest pain, palpitations and leg swelling.  Gastrointestinal: Negative.    Endocrine: Negative for polydipsia, polyphagia and polyuria.  Genitourinary: Negative.   Musculoskeletal: Negative.   Skin: Negative.   Allergic/Immunologic: Negative for immunocompromised state.  Neurological: Negative.   Hematological: Negative.   Psychiatric/Behavioral: Negative.        Objective:   Physical Exam  Constitutional: He is oriented to person, place, and time. He appears well-developed.  HENT:  Head: Normocephalic and atraumatic.  Right Ear: External ear normal.  Left Ear: External ear normal.  Mouth/Throat: Oropharynx is clear and moist.  Eyes: Conjunctivae are normal. Pupils are equal, round, and reactive to light.  Neck: Normal range of motion. Neck supple.  Cardiovascular: Normal rate, regular rhythm, normal heart sounds and intact distal pulses.   Pulmonary/Chest: Effort normal and breath sounds normal.  Abdominal: Soft. Bowel sounds are normal.  Musculoskeletal: Normal range of motion.  Neurological: He is alert and oriented to person, place, and time. He has normal reflexes.  Skin: Skin is warm and dry.  Psychiatric: He has a normal mood and affect. His behavior is normal. Judgment and thought content normal.      BP 114/66 mmHg  Pulse 72  Temp(Src) 97.9 F (36.6 C) (Oral)  Resp 18  Ht  (2.032 m)  Wt 261 lb (118.389 kg)  BMI 28.67 kg/m2  SpO2 98% Assessment & Plan:  1. PE (pulmonary embolism) Will continue Xarelto 20 mg daily. Recommend anticoagulation therapy for 6 months. Re  2. Anemia, unspecified anemia type Reviewed CBC from  12/31/2015, hemoglobin 10.6. Recommend a diet rich in foods containing iron and protein.   3. Family history of diabetes mellitus Recommend a lowfat, low carbohydrate diet divided over 5-6 small meals, increase water intake to 6-8 glasses, and 150 minutes per week of cardiovascular exercise.   - POCT urinalysis dipstick - Hemoglobin A1c  4. Tobacco abuse Smoking cessation instruction/counseling given:   counseled patient on the dangers of tobacco use, advised patient to stop smoking, and reviewed strategies to maximize success   RTC: 3 months for pulmonary embolism Massie Maroon, FNP

## 2016-01-03 ENCOUNTER — Encounter: Payer: Self-pay | Admitting: Family Medicine

## 2016-01-03 DIAGNOSIS — D649 Anemia, unspecified: Secondary | ICD-10-CM | POA: Insufficient documentation

## 2016-01-03 DIAGNOSIS — Z833 Family history of diabetes mellitus: Secondary | ICD-10-CM | POA: Insufficient documentation

## 2016-01-03 LAB — HEMOGLOBIN A1C
HEMOGLOBIN A1C: 5.9 % — AB (ref <5.7)
Mean Plasma Glucose: 123 mg/dL — ABNORMAL HIGH (ref <117)

## 2016-01-03 NOTE — Patient Instructions (Signed)
Pulmonary Embolism A pulmonary embolism (PE) is a sudden blockage or decrease of blood flow in one lung or both lungs. Most blockages come from a blood clot that travels from the legs or the pelvis to the lungs. PE is a dangerous and potentially life-threatening condition if it is not treated right away. CAUSES A pulmonary embolism occurs most commonly when a blood clot travels from one of your veins to your lungs. Rarely, PE is caused by air, fat, amniotic fluid, or part of a tumor traveling through your veins to your lungs. RISK FACTORS A PE is more likely to develop in:  People who smoke.  People who areolder, especially over 60 years of age.  People who are overweight (obese).  People who sit or lie still for a long time, such as during long-distance travel (over 4 hours), bed rest, hospitalization, or during recovery from certain medical conditions like a stroke.  People who do not engage in much physical activity (sedentary lifestyle).  People who have chronic breathing disorders.  People whohave a personal or family history of blood clots or blood clotting disease.  People whohave peripheral vascular disease (PVD), diabetes, or some types of cancer.  People who haveheart disease, especially if the person had a recent heart attack or has congestive heart failure.  People who have neurological diseases that affect the legs (leg paresis).  People who have had a traumatic injury, such as breaking a hip or leg.  People whohave recently had major or lengthy surgery, especially on the hip, knee, or abdomen.  People who have hada central line placed inside a large vein.  People who takemedicines that contain the hormone estrogen. These include birth control pills and hormone replacement therapy.  Pregnancy or during childbirth or the postpartum period. SIGNS AND SYMPTOMS  The symptoms of a PE usually start suddenly and include:  Shortness of breath while active or at  rest.  Coughing or coughing up blood or blood-tinged mucus.  Chest pain that is often worse with deep breaths.  Rapid or irregular heartbeat.  Feeling light-headed or dizzy.  Fainting.  Feelinganxious.  Sweating. There may also be pain and swelling in a leg if that is where the blood clot started. These symptoms may represent a serious problem that is an emergency. Do not wait to see if the symptoms will go away. Get medical help right away. Call your local emergency services (911 in the [place] Do not drive yourself to the hospital. DIAGNOSIS Your health care provider will take a medical history and perform a physical exam. You may also have other tests, including:  Blood tests to assess the clotting properties of your blood, assess oxygen levels in your blood, and find blood clots.  Imaging tests, such as CT, ultrasound, MRI, X-ray, and other tests to see if you have clots anywhere in your body.  An electrocardiogram (ECG) to look for heart strain from blood clots in the lungs. TREATMENT The main goals of PE treatment are:  To stop a blood clot from growing larger.  To stop new blood clots from forming. The type of treatment that you receive depends on many factors, such as the cause of your PE, your risk for bleeding or developing more clots, and other medical conditions that you have. Sometimes, a combination of treatments is necessary. This condition may be treated with:  Medicines, including newer oral blood thinners (anticoagulants), warfarin, low molecular weight heparins, thrombolytics, or heparins.  Wearing compression stockings or using different types   of devices.  Surgery (rare) to remove the blood clot or to place a filter in your abdomen to stop the blood clot from traveling to your lungs. Treatments for a PE are often divided into immediate treatment, long-term treatment (up to 3 months after PE), and extended treatment (more than 3 months after PE). Your  treatment may continue for several months. This is called maintenance therapy, and it is used to prevent the forming of new blood clots. You can work with your health care provider to choose the treatment program that is best for you. What are anticoagulants? Anticoagulants are medicines that treat PEs. They can stop current blood clots from growing and stop new clots from forming. They cannot dissolve existing clots. Your body dissolves clots by itself over time. Anticoagulants are given by mouth, by injection, or through an IV tube. What are thrombolytics? Thrombolytics are clot-dissolving medicines that are used to dissolve a PE. They carry a high risk of bleeding, so they tend to be used only in severe cases or if you have very low blood pressure. HOME CARE INSTRUCTIONS If you are taking a newer oral anticoagulant:  Take the medicine every single day at the same time each day.  Understand what foods and drugs interact with this medicine.  Understand that there are no regular blood tests required when using this medicine.  Understandthe side effects of this medicine, including excessive bruising or bleeding. Ask your health care provider or pharmacist about other possible side effects. If you are taking warfarin:  Understand how to take warfarin and know which foods can affect how warfarin works in Veterinary surgeon.  Understand that it is dangerous to taketoo much or too little warfarin. Too much warfarin increases the risk of bleeding. Too little warfarin continues to allow the risk for blood clots.  Follow your PT and INR blood testing schedule. The PT and INR results allow your health care provider to adjust your dose of warfarin. It is very important that you have your PT and INR tested as often as told by your health care provider.  Avoid major changes in your diet, or tell your health care provider before you change your diet. Arrange a visit with a registered dietitian to answer your  questions. Many foods, especially foods that are high in vitamin K, can interfere with warfarin and affect the PT and INR results. Eat a consistent amount of foods that are high in vitamin K, such as:  Spinach, kale, broccoli, cabbage, collard greens, turnip greens, Brussels sprouts, peas, cauliflower, seaweed, and parsley.  Beef liver and pork liver.  Green tea.  Soybean oil.  Tell your health care provider about any and all medicines, vitamins, and supplements that you take, including aspirin and other over-the-counter anti-inflammatory medicines. Be especially cautious with aspirin and anti-inflammatory medicines. Do not take those before you ask your health care provider if it is safe to do so. This is important because many medicines can interfere with warfarin and affect the PT and INR results.  Do not start or stop taking any over-the-counter or prescription medicine unless your health care provider or pharmacist tells you to do so. If you take warfarin, you will also need to do these things:  Hold pressure over cuts for longer than usual.  Tell your dentist and other health care providers that you are taking warfarin before you have any procedures in which bleeding may occur.  Avoid alcohol or drink very small amounts. Tell your health care provider  if you change your alcohol intake.  Do not use tobacco products, including cigarettes, chewing tobacco, and e-cigarettes. If you need help quitting, ask your health care provider.  Avoid contact sports. General Instructions  Take over-the-counter and prescription medicines only as told by your health care provider. Anticoagulant medicines can have side effects, including easy bruising and difficulty stopping bleeding. If you are prescribed an anticoagulant, you will also need to do these things:  Hold pressure over cuts for longer than usual.  Tell your dentist and other health care providers that you are taking anticoagulants  before you have any procedures in which bleeding may occur.  Avoid contact sports.  Wear a medical alert bracelet or carry a medical alert card that says you have had a PE.  Ask your health care provider how soon you can go back to your normal activities. Stay active to prevent new blood clots from forming.  Make sure to exercise while traveling or when you have been sitting or standing for a long period of time. It is very important to exercise. Exercise your legs by walking or by tightening and relaxing your leg muscles often. Take frequent walks.  Wear compression stockings as told by your health care provider to help prevent more blood clots from forming.  Do not use tobacco products, including cigarettes, chewing tobacco, and e-cigarettes. If you need help quitting, ask your health care provider.  Keep all follow-up appointments with your health care provider. This is important. PREVENTION Take these actions to decrease your risk of developing another PE:  Exercise regularly. For at least 30 minutes every day, engage in:  Activity that involves moving your arms and legs.  Activity that encourages good blood flow through your body by increasing your heart rate.  Exercise your arms and legs every hour during long-distance travel (over 4 hours). Drink plenty of water and avoid drinking alcohol while traveling.  Avoid sitting or lying in bed for long periods of time without moving your legs.  Maintain a weight that is appropriate for your height. Ask your health care provider what weight is healthy for you.  If you are a woman who is over 35 years of age, avoid unnecessary use of medicines that contain estrogen. These include birth control pills.  Do not smoke, especially if you take estrogen medicines. If you need help quitting, ask your health care provider.  If you are at very high risk for PE, wear compression stockings.  If you recently had a PE, have regularly scheduled  ultrasound testing on your legs to check for new blood clots. If you are hospitalized, prevention measures may include:  Early walking after surgery, as soon as your health care provider says that it is safe.  Receiving anticoagulants to prevent blood clots. If you cannot take anticoagulants, other options may be available, such as wearing compression stockings or using different types of devices. SEEK IMMEDIATE MEDICAL CARE IF:  You have new or increased pain, swelling, or redness in an arm or leg.  You have numbness or tingling in an arm or leg.  You have shortness of breath while active or at rest.  You have chest pain.  You have a rapid or irregular heartbeat.  You feel light-headed or dizzy.  You cough up blood.  You notice blood in your vomit, bowel movement, or urine.  You have a fever. These symptoms may represent a serious problem that is an emergency. Do not wait to see if the symptoms will   go away. Get medical help right away. Call your local emergency services (911 in the [place] Do not drive yourself to the hospital.   This information is not intended to replace advice given to you by your health care provider. Make sure you discuss any questions you have with your health care provider.   Document Released: 11/22/2000 Document Revised: 08/16/2015 Document Reviewed: 03/22/2015 Elsevier Interactive Patient Education 2016 [employer]. Anemia, Nonspecific Anemia is a condition in which the concentration of red blood cells or hemoglobin in the blood is below normal. Hemoglobin is a substance in red blood cells that carries oxygen to the tissues of the body. Anemia results in not enough oxygen reaching these tissues.  CAUSES  Common causes of anemia include:   Excessive bleeding. Bleeding may be internal or external. This includes excessive bleeding from periods (in women) or from the intestine.   Poor nutrition.   Chronic kidney, thyroid, and liver disease.  Bone  marrow disorders that decrease red blood cell production.  Cancer and treatments for cancer.  HIV, AIDS, and their treatments.  Spleen problems that increase red blood cell destruction.  Blood disorders.  Excess destruction of red blood cells due to infection, medicines, and autoimmune disorders. SIGNS AND SYMPTOMS   Minor weakness.   Dizziness.   Headache.  Palpitations.   Shortness of breath, especially with exercise.   Paleness.  Cold sensitivity.  Indigestion.  Nausea.  Difficulty sleeping.  Difficulty concentrating. Symptoms may occur suddenly or they may develop slowly.  DIAGNOSIS  Additional blood tests are often needed. These help your health care provider determine the best treatment. Your health care provider will check your stool for blood and look for other causes of blood loss.  TREATMENT  Treatment varies depending on the cause of the anemia. Treatment can include:   Supplements of iron, vitamin B12, or folic acid.   Hormone medicines.   A blood transfusion. This may be needed if blood loss is severe.   Hospitalization. This may be needed if there is significant continual blood loss.   Dietary changes.  Spleen removal. HOME CARE INSTRUCTIONS Keep all follow-up appointments. It often takes many weeks to correct anemia, and having your health care provider check on your condition and your response to treatment is very important. SEEK IMMEDIATE MEDICAL CARE IF:   You develop extreme weakness, shortness of breath, or chest pain.   You become dizzy or have trouble concentrating.  You develop heavy vaginal bleeding.   You develop a rash.   You have bloody or black, tarry stools.   You faint.   You vomit up blood.   You vomit repeatedly.   You have abdominal pain.  You have a fever or persistent symptoms for more than 2-3 days.   You have a fever and your symptoms suddenly get worse.   You are dehydrated.  MAKE  SURE YOU:  Understand these instructions.  Will watch your condition.  Will get help right away if you are not doing well or get worse.   This information is not intended to replace advice given to you by your health care provider. Make sure you discuss any questions you have with your health care provider.   Document Released: 01/02/2005 Document Revised: 07/28/2013 Document Reviewed: 05/21/2013 Elsevier Interactive Patient Education [employer]

## 2016-01-16 ENCOUNTER — Ambulatory Visit: Payer: Self-pay | Admitting: Family Medicine

## 2016-01-22 ENCOUNTER — Other Ambulatory Visit: Payer: Self-pay | Admitting: Internal Medicine

## 2016-01-22 MED ORDER — RIVAROXABAN 20 MG PO TABS: 20.0000 mg | ORAL_TABLET | Freq: Every day | ORAL | Status: DC

## 2016-01-22 NOTE — Telephone Encounter (Signed)
Refill for xalreto thanks!

## 2016-02-11 ENCOUNTER — Emergency Department (HOSPITAL_COMMUNITY): Payer: Self-pay

## 2016-02-11 ENCOUNTER — Encounter (HOSPITAL_COMMUNITY): Payer: Self-pay | Admitting: Nurse Practitioner

## 2016-02-11 ENCOUNTER — Emergency Department (HOSPITAL_COMMUNITY)
Admission: EM | Admit: 2016-02-11 | Discharge: 2016-02-11 | Disposition: A | Discharge status: Home / Self Care | Payer: Self-pay | Attending: Emergency Medicine | Admitting: Emergency Medicine

## 2016-02-11 DIAGNOSIS — F17200 Nicotine dependence, unspecified, uncomplicated: Secondary | ICD-10-CM | POA: Insufficient documentation

## 2016-02-11 DIAGNOSIS — Z86711 Personal history of pulmonary embolism: Secondary | ICD-10-CM | POA: Insufficient documentation

## 2016-02-11 DIAGNOSIS — R0602 Shortness of breath: Secondary | ICD-10-CM | POA: Insufficient documentation

## 2016-02-11 DIAGNOSIS — R0789 Other chest pain: Secondary | ICD-10-CM | POA: Insufficient documentation

## 2016-02-11 DIAGNOSIS — Z9119 Patient's noncompliance with other medical treatment and regimen: Secondary | ICD-10-CM | POA: Insufficient documentation

## 2016-02-11 LAB — BASIC METABOLIC PANEL
ANION GAP: 9 (ref 5–15)
BUN: 18 mg/dL (ref 6–20)
CO2: 25 mmol/L (ref 22–32)
Calcium: 8.9 mg/dL (ref 8.9–10.3)
Chloride: 104 mmol/L (ref 101–111)
Creatinine, Ser: 1.2 mg/dL (ref 0.61–1.24)
GFR calc Af Amer: >60 mL/min (ref >60)
GLUCOSE: 85 mg/dL (ref 65–99)
POTASSIUM: 4.1 mmol/L (ref 3.5–5.1)
Sodium: 138 mmol/L (ref 135–145)

## 2016-02-11 LAB — PROTIME-INR
INR: 0.97 (ref 0.00–1.49)
Prothrombin Time: 13.1 seconds (ref 11.6–15.2)

## 2016-02-11 LAB — CBC
HCT: 42.6 % (ref 39.0–52.0)
HEMOGLOBIN: 14 g/dL (ref 13.0–17.0)
MCH: 29.4 pg (ref 26.0–34.0)
MCHC: 32.9 g/dL (ref 30.0–36.0)
MCV: 89.3 fL (ref 78.0–100.0)
Platelets: 266 10*3/uL (ref 150–400)
RBC: 4.77 MIL/uL (ref 4.22–5.81)
RDW: 14.3 % (ref 11.5–15.5)
WBC: 9.4 10*3/uL (ref 4.0–10.5)

## 2016-02-11 LAB — I-STAT TROPONIN, ED: TROPONIN I, POC: 0 ng/mL (ref 0.00–0.08)

## 2016-02-11 LAB — BRAIN NATRIURETIC PEPTIDE: B NATRIURETIC PEPTIDE 5: 24.9 pg/mL (ref 0.0–100.0)

## 2016-02-11 MED ORDER — ACETAMINOPHEN 500 MG PO TABS
1000.0000 mg | ORAL_TABLET | Freq: Once | ORAL | Status: AC
  Administered 2016-02-11: 1000 mg via ORAL
  Filled 2016-02-11: qty 2

## 2016-02-11 MED ORDER — IOHEXOL 350 MG/ML SOLN
100.0000 mL | Freq: Once | INTRAVENOUS | Status: AC | PRN
  Administered 2016-02-11: 100 mL via INTRAVENOUS

## 2016-02-11 MED ORDER — IBUPROFEN 800 MG PO TABS
800.0000 mg | ORAL_TABLET | Freq: Once | ORAL | Status: DC
  Filled 2016-02-11: qty 1

## 2016-02-11 MED ORDER — XARELTO VTE STARTER PACK 15 & 20 MG PO TBPK: 15.0000 mg | ORAL_TABLET | ORAL | Status: DC

## 2016-02-11 NOTE — ED Notes (Signed)
Pt was diagnosed with PE, has been unable to obtain anticoagulants due to complications with insurance. C/o sharp chest pain which he states is typical to last time he had PEs on his right side.

## 2016-02-11 NOTE — ED Notes (Signed)
Pt verbalized understanding of dc instructions and refused a wheelchair or assistance in leaving.

## 2016-02-11 NOTE — ED Provider Notes (Signed)
CSN: [id number]     Arrival date & time 02/11/16  1748 History   First MD Initiated Contact with Patient 02/11/16 1814     Chief Complaint  Patient presents with  . Shortness of Breath  . Hx of PE's      (Consider location/radiation/quality/duration/timing/severity/associated sxs/prior Treatment) Patient is a 35 y.o. male presenting with shortness of breath. The history is provided by the patient.  Shortness of Breath Severity:  Moderate Onset quality:  Gradual Duration:  2 days Timing:  Constant Progression:  Worsening Chronicity:  Recurrent Context: activity   Relieved by:  Nothing Worsened by:  Nothing tried Ineffective treatments:  None tried Associated symptoms: chest pain   Associated symptoms: no abdominal pain, no fever, no headaches, no rash and no vomiting    35 yo M with chest pain and sob.  Started a couple days ago.  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  Unable to get it through a paperwork filing error with [employer] and [employer].  Unsure if this feels like prior PE.  Denies injury or new exercise.   Past Medical History  Diagnosis Date  . Pulmonary embolism (HCC)    History reviewed. No pertinent past surgical history. History reviewed. No pertinent family history. Social History  Substance Use Topics  . Smoking status: Current Every Day Smoker  . Smokeless tobacco: None  . Alcohol Use: Yes     Comment: OCCASIONAL    Review of Systems  Constitutional: Negative for fever and chills.  HENT: Negative for congestion and facial swelling.   Eyes: Negative for discharge and visual disturbance.  Respiratory: Positive for shortness of breath.   Cardiovascular: Positive for chest pain. Negative for palpitations.  Gastrointestinal: Negative for vomiting, abdominal pain and diarrhea.  Musculoskeletal: Negative for myalgias and arthralgias.  Skin: Negative for color change and rash.  Neurological: Negative for tremors, syncope and headaches.   Psychiatric/Behavioral: Negative for confusion and dysphoric mood.      Allergies  Review of patient's allergies indicates no known allergies.  Home Medications   Prior to Admission medications   Medication Sig Start Date End Date Taking? Authorizing Provider  guaiFENesin-dextromethorphan (ROBITUSSIN DM) 100-10 MG/5ML syrup Take 5 mLs by mouth 3 (three) times daily as needed for cough. 12/31/15   Benjiman Core, MD  methocarbamol (ROBAXIN) 500 MG tablet Take 1 tablet (500 mg total) by mouth every 8 (eight) hours as needed (muscla spams and muscle discomfort). 11/28/15   Vassie Loll, MD  XARELTO STARTER PACK 15 & 20 MG TBPK Take 15-20 mg by mouth as directed. Take as directed on package: Start with one  tablet by mouth twice a day with food. On Day 22, switch to one  tablet once a day with food. 02/11/16   Melene Plan, DO   BP 122/62 mmHg  Pulse 79  Temp(Src) 98.9 F (37.2 C) (Oral)  Resp 18  Ht  (2.007 m)  Wt 267 lb (121.11 kg)  BMI 30.07 kg/m2  SpO2 100% Physical Exam  Constitutional: He is oriented to person, place, and time. He appears well-developed and well-nourished.  HENT:  Head: Normocephalic and atraumatic.  Eyes: EOM are normal. Pupils are equal, round, and reactive to light.  Neck: Normal range of motion. Neck supple. No JVD present.  Cardiovascular: Normal rate and regular rhythm.  Exam reveals no gallop and no friction rub.   No murmur heard. Pulmonary/Chest: No respiratory distress. He has no wheezes. He exhibits tenderness (Right sided, recreates pain).  Abdominal: He exhibits no distension. There is no rebound and no guarding.  Musculoskeletal: Normal range of motion.  Neurological: He is alert and oriented to person, place, and time.  Skin: No rash noted. No pallor.  Psychiatric: He has a normal mood and affect. His behavior is normal.  Nursing note and vitals reviewed.   ED Course  Procedures (including critical care time) Labs Review Labs  Reviewed  BASIC METABOLIC PANEL  CBC  PROTIME-INR  BRAIN NATRIURETIC PEPTIDE  I-STAT TROPOININ, ED    Imaging Review Dg Chest 2 View  02/11/2016  CLINICAL DATA:  Shortness of breath, weakness, right chest pain, history of PE EXAM: CHEST  2 VIEW COMPARISON:  12/31/2015 FINDINGS: Lungs are clear.  No pleural effusion or pneumothorax. The heart is top-normal in size. Visualized osseous structures are within normal limits. IMPRESSION: No evidence of acute cardiopulmonary disease. Electronically Signed   By: Charline Bills M.D.   On: 02/11/2016 18:55   Ct Angio Chest Pe W/cm &/or Wo Cm  02/11/2016  CLINICAL DATA:  Chest pain, history of PE EXAM: CT ANGIOGRAPHY CHEST WITH CONTRAST TECHNIQUE: Multidetector CT imaging of the chest was performed using the standard protocol during bolus administration of intravenous contrast. Multiplanar CT image reconstructions and MIPs were obtained to evaluate the vascular anatomy. CONTRAST:  OMNIPAQUE IOHEXOL 350 MG/ML SOLN COMPARISON:  CTA chest dated 12/15/2015 and 11/27/2015 FINDINGS: No evidence of pulmonary embolism. Although not tailored for evaluation of the aorta, there is no evidence of thoracic aortic aneurysm or dissection. Mediastinum/Nodes: Heart is normal in size. No pericardial effusion. No suspicious mediastinal lymphadenopathy. Visualized thyroid is unremarkable. Lungs/Pleura: Visualized lungs are clear. No suspicious pulmonary nodules. No focal consolidation. No pleural effusion or pneumothorax. Upper abdomen: Visualized upper abdomen is unremarkable. Musculoskeletal: Visualized osseous structures are within normal limits. Review of the MIP images confirms the above findings. IMPRESSION: No evidence of pulmonary embolism. Normal CT chest. Electronically Signed   By: Charline Bills M.D.   On: 02/11/2016 19:28   I have personally reviewed and evaluated these images and lab results as part of my medical decision-making.   EKG  Interpretation   Date/Time:  Sunday February 11 2016 18:22:17 EST Ventricular Rate:  81 PR Interval:  143 QRS Duration: 80 QT Interval:  335 QTC Calculation: 389 R Axis:   41 Text Interpretation:  Sinus rhythm Probable left atrial enlargement RSR'  in V1 or V2, probably normal variant ST elevation suggests acute  pericarditis Baseline wander in lead(s) V4 V5 No significant change since  last tracing Confirmed by Madisyn Mawhinney MD, Reuel Boom ([id number]) on 02/11/2016 8:04:32 PM      MDM   Final diagnoses:  Chest wall pain    35 yo M with chest pain.  Hx of PE not on anticoag due to non compliance.  No hemodynamic instability or hypoxia.  Pain reproduced on exam.  CT scan ordered.  Will start back on xarelto, case management message left.     CT negative for PE.  Trop and bnp negative.  D/c home.   8:28 PM:  I have discussed the diagnosis/risks/treatment options with the patient and family and believe the pt to be eligible for discharge home to follow-up with PCP. We also discussed returning to the ED immediately if new or worsening sx occur. We discussed the sx which are most concerning (e.g., sudden worsening pain, fever, inability to tolerate by mouth) that necessitate immediate return. Medications administered to the patient during their visit and any new  prescriptions provided to the patient are listed below.  Medications given during this visit Medications  acetaminophen (TYLENOL) tablet 1,000 mg (not administered)  ibuprofen (ADVIL,MOTRIN) tablet 800 mg (not administered)  iohexol (OMNIPAQUE) 350 MG/ML injection 100 mL (100 mLs Intravenous Contrast Given 02/11/16 1902)    New Prescriptions   XARELTO STARTER PACK 15 & 20 MG TBPK    Take 15-20 mg by mouth as directed. Take as directed on package: Start with one 15mg  tablet by mouth twice a day with food. On Day 22, switch to one 20mg  tablet once a day with food.    The patient appears reasonably screen and/or stabilized for discharge and I  doubt any other medical condition or other [facility] requiring further screening, evaluation, or treatment in the ED at this time prior to discharge.    Melene Planan Willena Jeancharles, DO 02/11/16 2028

## 2016-02-11 NOTE — Discharge Instructions (Signed)

## 2016-02-12 ENCOUNTER — Telehealth: Payer: Self-pay | Admitting: *Deleted

## 2016-02-12 NOTE — Telephone Encounter (Signed)
Samples are being given to patient for 12 weeks. LOT #[id number] exp: 07/2018. Thanks!

## 2016-02-12 NOTE — Telephone Encounter (Signed)
Pt mother called and stated pt has been out of his Xarelto for 3wks. Pt needs a refill of this medication. Please advise provider. Thanks

## 2016-04-04 ENCOUNTER — Ambulatory Visit: Payer: Self-pay | Admitting: Family Medicine

## 2016-04-26 ENCOUNTER — Ambulatory Visit: Payer: Self-pay | Admitting: Family Medicine

## 2016-05-28 ENCOUNTER — Ambulatory Visit: Payer: Self-pay | Admitting: Family Medicine

## 2016-07-05 ENCOUNTER — Ambulatory Visit: Payer: Self-pay | Admitting: Family Medicine

## 2016-07-08 ENCOUNTER — Encounter (HOSPITAL_COMMUNITY): Payer: Self-pay

## 2016-07-08 ENCOUNTER — Emergency Department (HOSPITAL_COMMUNITY)
Admission: EM | Admit: 2016-07-08 | Discharge: 2016-07-09 | Disposition: A | Discharge status: Home / Self Care | Payer: Self-pay | Attending: Emergency Medicine | Admitting: Emergency Medicine

## 2016-07-08 DIAGNOSIS — F1010 Alcohol abuse, uncomplicated: Secondary | ICD-10-CM | POA: Insufficient documentation

## 2016-07-08 DIAGNOSIS — F17200 Nicotine dependence, unspecified, uncomplicated: Secondary | ICD-10-CM | POA: Insufficient documentation

## 2016-07-08 DIAGNOSIS — Z79899 Other long term (current) drug therapy: Secondary | ICD-10-CM | POA: Insufficient documentation

## 2016-07-08 NOTE — ED Triage Notes (Signed)
Pt presents to ed with ems from home, the ems call came out as cardiac arrest, on ems arrival the patient was throwing up, when friends were asked what happened they said that he has a history of a PE and he was just laying there so they started doing CPR patient states that he is intoxicated, alert on arrival to ed patient reports he has been drinking and appears to be intoxicated

## 2016-07-08 NOTE — ED Provider Notes (Signed)
[facility]-EMERGENCY DEPT Provider Note   CSN: [id number] Arrival date & time: 07/08/16  2305  First Provider Contact:   First MD Initiated Contact with Patient 07/08/16 2328      By signing my name below, I, [name], attest that this documentation has been prepared under the direction and in the presence of Shon Baton, MD. Electronically Signed: Angelene Giovanni, ED Scribe. 07/08/16. 11:41 PM.   History   Chief Complaint Chief Complaint  Patient presents with  . Alcohol Problem    HPI Comments: Level 5 Caveat due to alcohol intoxication Craig Daugherty is a 35 y.o. male brought in by ambulance, who presents to the Emergency Department for evaluation s/p ETOH use tonight. Per EMS, call to pt's home came in as cardiac arrest however on arrival, pt was vomiting. Pt is currently sleeping and unable to answer any questions.    The history is provided by the patient. No language interpreter was used.      Past Medical History:  Diagnosis Date  . Pulmonary embolism [facility])     Patient Active Problem List   Diagnosis Date Noted  . 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 Family history of diabetes mellitus 01/03/2016  . Tobacco abuse   . Chest pain, pleuritic   . PE (pulmonary embolism) 11/27/2015    History reviewed. No pertinent surgical history.     Home Medications    Prior to Admission medications   Medication Sig Start Date End Date Taking? Authorizing Provider  guaiFENesin-dextromethorphan (ROBITUSSIN DM) 100-10 MG/5ML syrup Take 5 mLs by mouth 3 (three) times daily as needed for cough. 12/31/15   Benjiman Core, MD  methocarbamol (ROBAXIN) 500 MG tablet Take 1 tablet (500 mg total) by mouth every 8 (eight) hours as needed (muscla spams and muscle discomfort). 11/28/15   Vassie Loll, MD  XARELTO STARTER PACK 15 & 20 MG TBPK Take 15-20 mg by mouth as directed. Take as directed on package: Start with one  tablet by mouth twice a day with food. On Day 22, switch  to one  tablet once a day with food. 02/11/16   Melene Plan, DO    Family History History reviewed. No pertinent family history.  Social History Social History  Substance Use Topics  . Smoking status: Current Every Day Smoker  . Smokeless tobacco: Never Used  . Alcohol use Yes     Comment: OCCASIONAL     Allergies   Review of patient's allergies indicates no known allergies.   Review of Systems Review of Systems  Unable to perform ROS: Other     Physical Exam Updated Vital Signs BP 104/72   Pulse (!) 52   Temp 98.2 F (36.8 C) (Oral)   Resp 12   SpO2 98%   Physical Exam  Constitutional: He appears well-developed and well-nourished.  Sleeping, arousable to noxious stimuli  HENT:  Head: Normocephalic and atraumatic.  Eyes: Pupils are equal, round, and reactive to light.  Pupils 3 mm and reactive bilaterally  Cardiovascular: Normal rate, regular rhythm and normal heart sounds.   No murmur heard. Pulmonary/Chest: Effort normal and breath sounds normal. No respiratory distress. He has no wheezes.  Abdominal: Soft. Bowel sounds are normal. There is no tenderness.  Neurological:  Somnolent but arousable, appears to move all 4 extremities  Skin: Skin is warm and dry.  Psychiatric: He has a normal mood and affect.  Nursing note and vitals reviewed.    ED Treatments / Results  DIAGNOSTIC STUDIES: Oxygen Saturation is  98% on RA, normal by my interpretation.    COORDINATION OF CARE: 11:30 PM- Pt advised of plan for treatment and pt agrees. Pt will receive lab work for further evaluation.    Labs (all labs ordered are listed, but only abnormal results are displayed) Labs Reviewed  ETHANOL - Abnormal; Notable for the following:       Result Value   Alcohol, Ethyl (B) 115 (*)    All other components within normal limits  URINE RAPID DRUG SCREEN, HOSP PERFORMED - Abnormal; Notable for the following:    Tetrahydrocannabinol POSITIVE (*)    All other components  within normal limits  CBC WITH DIFFERENTIAL/PLATELET - Abnormal; Notable for the following:    RBC 4.13 (*)    Hemoglobin 12.0 (*)    HCT 37.8 (*)    All other components within normal limits  BASIC METABOLIC PANEL - Abnormal; Notable for the following:    Chloride 112 (*)    Calcium 8.3 (*)    Anion gap 3 (*)    All other components within normal limits    EKG  ED ECG REPORT   Date: 07/09/2016  Rate: 55   Rhythm: sinus bradycardia  QRS Axis: normal  Intervals: normal  ST/T Wave abnormalities: early repolarization  Conduction Disutrbances:none  Narrative Interpretation:   Old EKG Reviewed: none available  I have personally reviewed the EKG tracing and agree with the computerized printout as noted.  Radiology No results found.  Procedures Procedures (including critical care time)  Medications Ordered in ED Medications - No data to display   Initial Impression / Assessment and Plan / ED Course  Shon Baton, MD has reviewed the triage vital signs and the nursing notes.  Pertinent labs & imaging results that were available during my care of the patient were reviewed by me and considered in my medical decision making (see chart for details).  Clinical Course    Patient presents by EMS with altered mental status. Friends called because he was unresponsive at home. They administered CPR. However, patient awoke and stated that he drank too much. He is arousable but not cooperative with history taking. He is otherwise nontoxic and nonfocal. EtOH 115 and UDS positive for marijuana. He is remained stable while in the emergency department.  On multiple rechecks, he is resting comfortably. He is now more alert and able to tolerate fluids. He can ambulate independently. Suspect acute intoxication.  Final Clinical Impressions(s) / ED Diagnoses   Final diagnoses:  Alcohol abuse   I personally performed the services described in this documentation, which was scribed in my  presence. The recorded information has been reviewed and is accurate.   New Prescriptions New Prescriptions   No medications on file     Shon Baton, MD 07/09/16 [phone]

## 2016-07-09 LAB — RAPID URINE DRUG SCREEN, HOSP PERFORMED
Amphetamines: NOT DETECTED
Barbiturates: NOT DETECTED
Benzodiazepines: NOT DETECTED
Cocaine: NOT DETECTED
Opiates: NOT DETECTED
Tetrahydrocannabinol: POSITIVE — AB

## 2016-07-09 LAB — CBC WITH DIFFERENTIAL/PLATELET
Basophils Absolute: 0 10*3/uL (ref 0.0–0.1)
Basophils Relative: 1 %
Eosinophils Absolute: 0.5 10*3/uL (ref 0.0–0.7)
Eosinophils Relative: 8 %
HEMATOCRIT: 37.8 % — AB (ref 39.0–52.0)
HEMOGLOBIN: 12 g/dL — AB (ref 13.0–17.0)
LYMPHS ABS: 2.2 10*3/uL (ref 0.7–4.0)
LYMPHS PCT: 32 %
MCH: 29.1 pg (ref 26.0–34.0)
MCHC: 31.7 g/dL (ref 30.0–36.0)
MCV: 91.5 fL (ref 78.0–100.0)
Monocytes Absolute: 0.6 10*3/uL (ref 0.1–1.0)
Monocytes Relative: 9 %
NEUTROS ABS: 3.4 10*3/uL (ref 1.7–7.7)
NEUTROS PCT: 50 %
Platelets: 251 10*3/uL (ref 150–400)
RBC: 4.13 MIL/uL — AB (ref 4.22–5.81)
RDW: 14.1 % (ref 11.5–15.5)
WBC: 6.7 10*3/uL (ref 4.0–10.5)

## 2016-07-09 LAB — BASIC METABOLIC PANEL
Anion gap: 3 — ABNORMAL LOW (ref 5–15)
BUN: 6 mg/dL (ref 6–20)
CHLORIDE: 112 mmol/L — AB (ref 101–111)
CO2: 24 mmol/L (ref 22–32)
Calcium: 8.3 mg/dL — ABNORMAL LOW (ref 8.9–10.3)
Creatinine, Ser: 1.17 mg/dL (ref 0.61–1.24)
GFR calc Af Amer: >60 mL/min (ref >60)
GFR calc non Af Amer: >60 mL/min (ref >60)
Glucose, Bld: 93 mg/dL (ref 65–99)
POTASSIUM: 3.7 mmol/L (ref 3.5–5.1)
SODIUM: 139 mmol/L (ref 135–145)

## 2016-07-09 LAB — ETHANOL: ALCOHOL ETHYL (B): 115 mg/dL — AB (ref <5)

## 2016-07-09 NOTE — ED Notes (Signed)
Pt alert and ambulatory off the unit. Pt gait steady and even.

## 2016-07-09 NOTE — ED Notes (Signed)
The pt opens his eyes c/o being cold  Then closed his eyes  Sleeping unless stimulated no respiratory difficulty  He reported that he  Drank too much tonight

## 2016-07-09 NOTE — ED Notes (Signed)
Pt sleeping soundly  Snoring  Not waking up with stimulation

## 2016-09-29 ENCOUNTER — Emergency Department (HOSPITAL_COMMUNITY)
Admission: EM | Admit: 2016-09-29 | Discharge: 2016-09-29 | Disposition: A | Discharge status: Home / Self Care | Payer: Self-pay | Attending: Emergency Medicine | Admitting: Emergency Medicine

## 2016-09-29 ENCOUNTER — Emergency Department (HOSPITAL_COMMUNITY): Payer: Self-pay

## 2016-09-29 ENCOUNTER — Encounter (HOSPITAL_COMMUNITY): Payer: Self-pay | Admitting: Emergency Medicine

## 2016-09-29 DIAGNOSIS — Y999 Unspecified external cause status: Secondary | ICD-10-CM | POA: Insufficient documentation

## 2016-09-29 DIAGNOSIS — Z79899 Other long term (current) drug therapy: Secondary | ICD-10-CM | POA: Insufficient documentation

## 2016-09-29 DIAGNOSIS — Y92410 Unspecified street and highway as the place of occurrence of the external cause: Secondary | ICD-10-CM | POA: Insufficient documentation

## 2016-09-29 DIAGNOSIS — S82831A Other fracture of upper and lower end of right fibula, initial encounter for closed fracture: Secondary | ICD-10-CM | POA: Insufficient documentation

## 2016-09-29 DIAGNOSIS — F17200 Nicotine dependence, unspecified, uncomplicated: Secondary | ICD-10-CM | POA: Insufficient documentation

## 2016-09-29 DIAGNOSIS — Y939 Activity, unspecified: Secondary | ICD-10-CM | POA: Insufficient documentation

## 2016-09-29 MED ORDER — HYDROCODONE-ACETAMINOPHEN 5-325 MG PO TABS: 1.0000 | ORAL_TABLET | Freq: Four times a day (QID) | ORAL | 0 refills | Status: DC | PRN

## 2016-09-29 NOTE — Discharge Instructions (Signed)
Medications: Ibuprofen  Medications: Norco  Treatment: Take 1-2 Norco every 4-6 hours as needed for severe pain. Do not drive or operate machinery when taking this medication and only take as prescribed. Do not share this medication. Wear the boot at all times except when bathing, however do not bear weight on leg. Use crutches to ambulate carefully.  Follow-up: Please follow-up with the orthopedic doctor, Dr. Roda ShuttersXu, for further evaluation and treatment of your symptoms. Please return to emergency department if you develop any new or worsening symptoms.
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
## 2016-10-10 ENCOUNTER — Ambulatory Visit (INDEPENDENT_AMBULATORY_CARE_PROVIDER_SITE_OTHER): Payer: Self-pay | Admitting: Orthopaedic Surgery

## 2016-11-14 ENCOUNTER — Ambulatory Visit: Payer: Self-pay | Admitting: Family Medicine

## 2017-01-06 ENCOUNTER — Ambulatory Visit: Payer: Self-pay | Admitting: Family Medicine

## 2017-03-05 ENCOUNTER — Encounter (HOSPITAL_COMMUNITY): Payer: Self-pay | Admitting: Emergency Medicine

## 2017-03-05 DIAGNOSIS — F17200 Nicotine dependence, unspecified, uncomplicated: Secondary | ICD-10-CM | POA: Insufficient documentation

## 2017-03-05 DIAGNOSIS — L299 Pruritus, unspecified: Secondary | ICD-10-CM | POA: Insufficient documentation

## 2017-03-05 NOTE — ED Triage Notes (Signed)
patient with wound site to medial right shin. Odd appearing wound with center area grey and crusty about size of nickel. Mild swelling around area covering size of silver dollar. Patient noticed about 3 weeks ago, started as simple swelling. Patient reports occasion drainage.

## 2017-03-06 ENCOUNTER — Emergency Department (HOSPITAL_COMMUNITY)
Admission: EM | Admit: 2017-03-06 | Discharge: 2017-03-06 | Disposition: A | Discharge status: Home / Self Care | Payer: Self-pay | Attending: Emergency Medicine | Admitting: Emergency Medicine

## 2017-03-06 DIAGNOSIS — L989 Disorder of the skin and subcutaneous tissue, unspecified: Secondary | ICD-10-CM

## 2017-03-06 MED ORDER — TRIAMCINOLONE ACETONIDE 0.1 % EX OINT
TOPICAL_OINTMENT | Freq: Two times a day (BID) | CUTANEOUS | Status: DC
  Administered 2017-03-06: 05:00:00 via TOPICAL
  Filled 2017-03-06: qty 15

## 2017-03-06 NOTE — Discharge Instructions (Signed)
Contact a health care provider if: Your symptoms are not controlled with medicine. Your joints are painful or swollen. Get help right away if: You have a fever. You have pain in your abdomen. Your tongue or lips are swollen. Your eyelids are swollen. Your chest or throat feels tight. You have trouble breathing or swallowing. 

## 2017-03-06 NOTE — ED Provider Notes (Signed)
[facility]-EMERGENCY DEPT Provider Note   CSN: [id number] Arrival date & time: 03/05/17  2212     History   Chief Complaint Chief Complaint  Patient presents with  . Insect Bite    possible    HPI Craig Daugherty is a 36 y.o. male with history of pulmonary embolism, and homelessness. Patient presents emergency Department with chief complaint of pruritic lesion on his right medial lower extremity. Patient states that he developed about 3 weeks ago and seemed to be like a mosquito bite. It seemed to get infected. There was some discharge that is expressed from the wound. It is no longer swollen and there is no longer discharge, however, there is a nickel sized scab on the medial leg and it is extremely pruritic. Patient states that he tried hydrocortisone cream, but it did not seem to help it. He denies any other lesions, fevers, chills or other signs of systemic infection.  HPI  Past Medical History:  Diagnosis Date  . Pulmonary embolism [facility])     Patient Active Problem List   Diagnosis Date Noted  . 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 Family history of diabetes mellitus 01/03/2016  . Tobacco abuse   . Chest pain, pleuritic   . PE (pulmonary embolism) 11/27/2015    History reviewed. No pertinent surgical history.     Home Medications    Prior to Admission medications   Medication Sig Start Date End Date Taking? Authorizing Provider  guaiFENesin-dextromethorphan (ROBITUSSIN DM) 100-10 MG/5ML syrup Take 5 mLs by mouth 3 (three) times daily as needed for cough. 12/31/15   Benjiman CoreNathan Pickering, MD  HYDROcodone-acetaminophen (NORCO/VICODIN) 5-325 MG tablet Take 1-2 tablets by mouth every 6 (six) hours as needed for severe pain. 09/29/16   Emi HolesAlexandra M Law, PA-C  methocarbamol (ROBAXIN) 500 MG tablet Take 1 tablet (500 mg total) by mouth every 8 (eight) hours as needed (muscla spams and muscle discomfort). 11/28/15   Vassie Lollarlos Madera, MD  XARELTO STARTER PACK 15 & 20 MG TBPK Take 15-20 mg by mouth  as directed. Take as directed on package: Start with one 15mg  tablet by mouth twice a day with food. On Day 22, switch to one 20mg  tablet once a day with food. 02/11/16   Melene Planan Floyd, DO    Family History History reviewed. No pertinent family history.  Social History Social History  Substance Use Topics  . Smoking status: Current Every Day Smoker  . Smokeless tobacco: Never Used  . Alcohol use Yes     Comment: OCCASIONAL     Allergies   Patient has no known allergies.   Review of Systems Review of Systems  Constitutional: Negative for chills and fever.  Skin: Positive for wound.     Physical Exam Updated Vital Signs BP 104/72 (BP Location: Right Arm)   Pulse 88   Temp 99.1 F (37.3 C) (Oral)   Resp 16   Ht 6\' 8"  (2.032 m)   Wt 117.1 kg   SpO2 99%   BMI 28.36 kg/m   Physical Exam  Constitutional: He appears well-developed and well-nourished. No distress.  HENT:  Head: Normocephalic and atraumatic.  Eyes: Conjunctivae are normal. No scleral icterus.  Neck: Normal range of motion. Neck supple.  Cardiovascular: Normal rate, regular rhythm and normal heart sounds.   Pulmonary/Chest: Effort normal and breath sounds normal. No respiratory distress.  Abdominal: Soft. There is no tenderness.  Musculoskeletal: He exhibits no edema.  Neurological: He is alert.  Skin: Skin is warm and dry. He is  not diaphoretic.  Medical sized scab on the medial left lower leg. No fluctuance or induration. Areas of excoriation surrounding the lesion. No signs of infection  Psychiatric: His behavior is normal.  Nursing note and vitals reviewed.    ED Treatments / Results  Labs (all labs ordered are listed, but only abnormal results are displayed) Labs Reviewed - No data to display  EKG  EKG Interpretation None       Radiology No results found.  Procedures Procedures (including critical care time)  Medications Ordered in ED Medications  triamcinolone ointment (KENALOG) 0.1  % (not administered)     Initial Impression / Assessment and Plan / ED Course  I have reviewed the triage vital signs and the nursing notes.  Pertinent labs & imaging results that were available during my care of the patient were reviewed by me and considered in my medical decision making (see chart for details).     Patient with pruritic lesion on the right lower extremity. No signs of infection. Given triamcinolone ointment for treatment. Discussed return precautions. Follow up with PCP in the next 2 days.  Final Clinical Impressions(s) / ED Diagnoses   Final diagnoses:  Skin lesion of right leg    New Prescriptions New Prescriptions   No medications on file     Arthor Captain, PA-C 03/06/17 0443    Zadie Rhine, MD 03/06/17 [phone]

## 2017-03-06 NOTE — ED Notes (Signed)
Brought to room from waiting room.  c/o abdominal pain at this time.  States it just started.  Rating at 9/10 sharp pain in upper abdomen.

## 2017-03-11 DIAGNOSIS — F17200 Nicotine dependence, unspecified, uncomplicated: Secondary | ICD-10-CM | POA: Insufficient documentation

## 2017-03-11 DIAGNOSIS — R0789 Other chest pain: Secondary | ICD-10-CM | POA: Insufficient documentation

## 2017-03-11 DIAGNOSIS — M79662 Pain in left lower leg: Secondary | ICD-10-CM | POA: Insufficient documentation

## 2017-03-12 ENCOUNTER — Encounter (HOSPITAL_COMMUNITY): Payer: Self-pay | Admitting: *Deleted

## 2017-03-12 ENCOUNTER — Emergency Department (HOSPITAL_COMMUNITY)
Admission: EM | Admit: 2017-03-12 | Discharge: 2017-03-12 | Disposition: A | Discharge status: Home / Self Care | Payer: Self-pay | Attending: Emergency Medicine | Admitting: Emergency Medicine

## 2017-03-12 ENCOUNTER — Emergency Department (HOSPITAL_COMMUNITY): Payer: Self-pay

## 2017-03-12 DIAGNOSIS — R079 Chest pain, unspecified: Secondary | ICD-10-CM

## 2017-03-12 DIAGNOSIS — M79605 Pain in left leg: Secondary | ICD-10-CM

## 2017-03-12 LAB — CBC
HEMATOCRIT: 39.2 % (ref 39.0–52.0)
HEMOGLOBIN: 13.2 g/dL (ref 13.0–17.0)
MCH: 29.2 pg (ref 26.0–34.0)
MCHC: 33.7 g/dL (ref 30.0–36.0)
MCV: 86.7 fL (ref 78.0–100.0)
Platelets: 242 10*3/uL (ref 150–400)
RBC: 4.52 MIL/uL (ref 4.22–5.81)
RDW: 13.7 % (ref 11.5–15.5)
WBC: 11.6 10*3/uL — ABNORMAL HIGH (ref 4.0–10.5)

## 2017-03-12 LAB — I-STAT TROPONIN, ED: Troponin i, poc: 0 ng/mL (ref 0.00–0.08)

## 2017-03-12 LAB — BASIC METABOLIC PANEL
ANION GAP: 15 (ref 5–15)
BUN: 16 mg/dL (ref 6–20)
CALCIUM: 9.2 mg/dL (ref 8.9–10.3)
CO2: 19 mmol/L — AB (ref 22–32)
Chloride: 100 mmol/L — ABNORMAL LOW (ref 101–111)
Creatinine, Ser: 1.53 mg/dL — ABNORMAL HIGH (ref 0.61–1.24)
GFR calc Af Amer: >60 mL/min (ref >60)
GFR calc non Af Amer: 57 mL/min — ABNORMAL LOW (ref >60)
GLUCOSE: 74 mg/dL (ref 65–99)
POTASSIUM: 4 mmol/L (ref 3.5–5.1)
Sodium: 134 mmol/L — ABNORMAL LOW (ref 135–145)

## 2017-03-12 LAB — D-DIMER, QUANTITATIVE: D-Dimer, Quant: 0.36 ug/mL-FEU (ref 0.00–0.50)

## 2017-03-12 NOTE — ED Triage Notes (Signed)
Pt to ED c/o chest pain that started when pt was walking to the hospital also reports bilateral leg cramping onset today. Pt has a hx of PE, reports sob with CP

## 2017-03-12 NOTE — Discharge Instructions (Signed)
Please read attached information. If you experience any new or worsening signs or symptoms please return to the emergency room for evaluation. Please follow-up with your primary care provider or specialist as discussed.  °

## 2017-03-12 NOTE — ED Provider Notes (Signed)
[facility]-EMERGENCY DEPT Provider Note   CSN: [id number] Arrival date & time: 03/11/17  2346     History   Chief Complaint Chief Complaint  Patient presents with  . Leg Pain  . Chest Pain    HPI Craig Daugherty is a 36 y.o. male.  HPI   36 year old male presents today with several complaints.  Patient is having difficulty describing why he was in the emergency room.  He notes that while walking to the emergency room he developed cramping in his left lower leg.  Patient notes that he started to have shortness of breath and left anterior lateral chest wall pain.  Patient has a history of pulmonary embolism in the past and was concerned for this.  Time of evaluation patient denies any shortness of breath, reports he still having minor tenderness along the left wall.  He reports the cramps in his lower leg have gone away.  She is unable to provide any significant information surrounding his last pulmonary embolus.   Past Medical History:  Diagnosis Date  . Pulmonary embolism [facility])     Patient Active Problem List   Diagnosis Date Noted  . 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 Family history of diabetes mellitus 01/03/2016  . Tobacco abuse   . Chest pain, pleuritic   . PE (pulmonary embolism) 11/27/2015    No past surgical history on file.    Home Medications    Prior to Admission medications   Medication Sig Start Date End Date Taking? Authorizing Provider  albuterol (PROVENTIL HFA;VENTOLIN HFA) 108 (90 Base) MCG/ACT inhaler Inhale 1-2 puffs into the lungs every 6 (six) hours as needed for wheezing or shortness of breath.   Yes Historical Provider, MD    Family History No family history on file.  Social History Social History  Substance Use Topics  . Smoking status: Current Every Day Smoker  . Smokeless tobacco: Never Used  . Alcohol use Yes     Comment: OCCASIONAL     Allergies   Patient has no known allergies.   Review of Systems Review of Systems  All other systems  reviewed and are negative.  Physical Exam Updated Vital Signs BP 122/66   Pulse 82   Temp 98.1 F (36.7 C) (Oral)   Resp (!) 26   Ht  (2.032 m)   Wt 122.5 kg   SpO2 97%   BMI 29.66 kg/m   Physical Exam  Constitutional: He is oriented to person, place, and time. He appears well-developed and well-nourished.  HENT:  Head: Normocephalic and atraumatic.  Eyes: Conjunctivae are normal. Pupils are equal, round, and reactive to light. Right eye exhibits no discharge. Left eye exhibits no discharge. No scleral icterus.  Neck: Normal range of motion. No JVD present. No tracheal deviation present.  Pulmonary/Chest: Effort normal and breath sounds normal. No stridor. No respiratory distress. He has no wheezes. He has no rales. He exhibits no tenderness.  Abdominal: Soft. He exhibits no distension. There is no tenderness.  Musculoskeletal: He exhibits no edema or tenderness.  Neurological: He is alert and oriented to person, place, and time. Coordination normal.  Psychiatric: He has a normal mood and affect. His behavior is normal. Judgment and thought content normal.  Nursing note and vitals reviewed.   ED Treatments / Results  Labs (all labs ordered are listed, but only abnormal results are displayed) Labs Reviewed  BASIC METABOLIC PANEL - Abnormal; Notable for the following:       Result Value  Sodium 134 (*)    Chloride 100 (*)    CO2 19 (*)    Creatinine, Ser 1.53 (*)    GFR calc non Af Amer 57 (*)    All other components within normal limits  CBC - Abnormal; Notable for the following:    WBC 11.6 (*)    All other components within normal limits  D-DIMER, QUANTITATIVE (NOT AT [facility])  Rosezena Sensor, ED    EKG  EKG Interpretation None       Radiology Dg Chest 2 View  Result Date: 03/12/2017 CLINICAL DATA:  Chest pain tonight EXAM: CHEST  2 VIEW COMPARISON:  02/11/2016 FINDINGS: The lungs are clear. The pulmonary vasculature is normal. Heart size is normal.  Hilar and mediastinal contours are unremarkable. There is no pleural effusion. IMPRESSION: No active cardiopulmonary disease. Electronically Signed   By: Ellery Plunk M.D.   On: 03/12/2017 05:58    Procedures Procedures (including critical care time)  Medications Ordered in ED Medications - No data to display   Initial Impression / Assessment and Plan / ED Course  I have reviewed the triage vital signs and the nursing notes.  Pertinent labs & imaging results that were available during my care of the patient were reviewed by me and considered in my medical decision making (see chart for details).      Final Clinical Impressions(s) / ED Diagnoses   Final diagnoses:  Chest pain, unspecified type  Pain of left lower extremity   Labs: I-STAT troponin, BMP, d-dimer, CBC  Imaging:  Consults:  Therapeutics:  Discharge Meds:   Assessment/Plan:   36 year old male presents today with several complaints.  Patient is concerned about pulmonary embolism.  He has a history of the same, he is not dyspneic or significantly tachycardic here.  He has a negative d-dimer, his chest pain is tenderness along the chest wall.  I have very low suspicion for ACS, PE, or any other significant intrathoracic abnormality.  Patient has no significant findings in his lower extremity including swelling or edema.  Patient will be instructed to use ibuprofen and Tylenol, up with primary care return immediately if any new or worsening signs or symptoms present.  He verbalized understanding and agreement to today's plan.   New Prescriptions New Prescriptions   No medications on file     Eyvonne Mechanic, PA-C 03/12/17 [id number]    Shon Baton, MD 03/12/17 [phone]

## 2018-12-13 IMAGING — CR DG CHEST 2V
2 series · 2 of 2 positions shown · non-contrast
Comparison: 02/11/2016

CLINICAL DATA: Chest pain tonight

EXAM:
CHEST  2 VIEW

[chest pa]
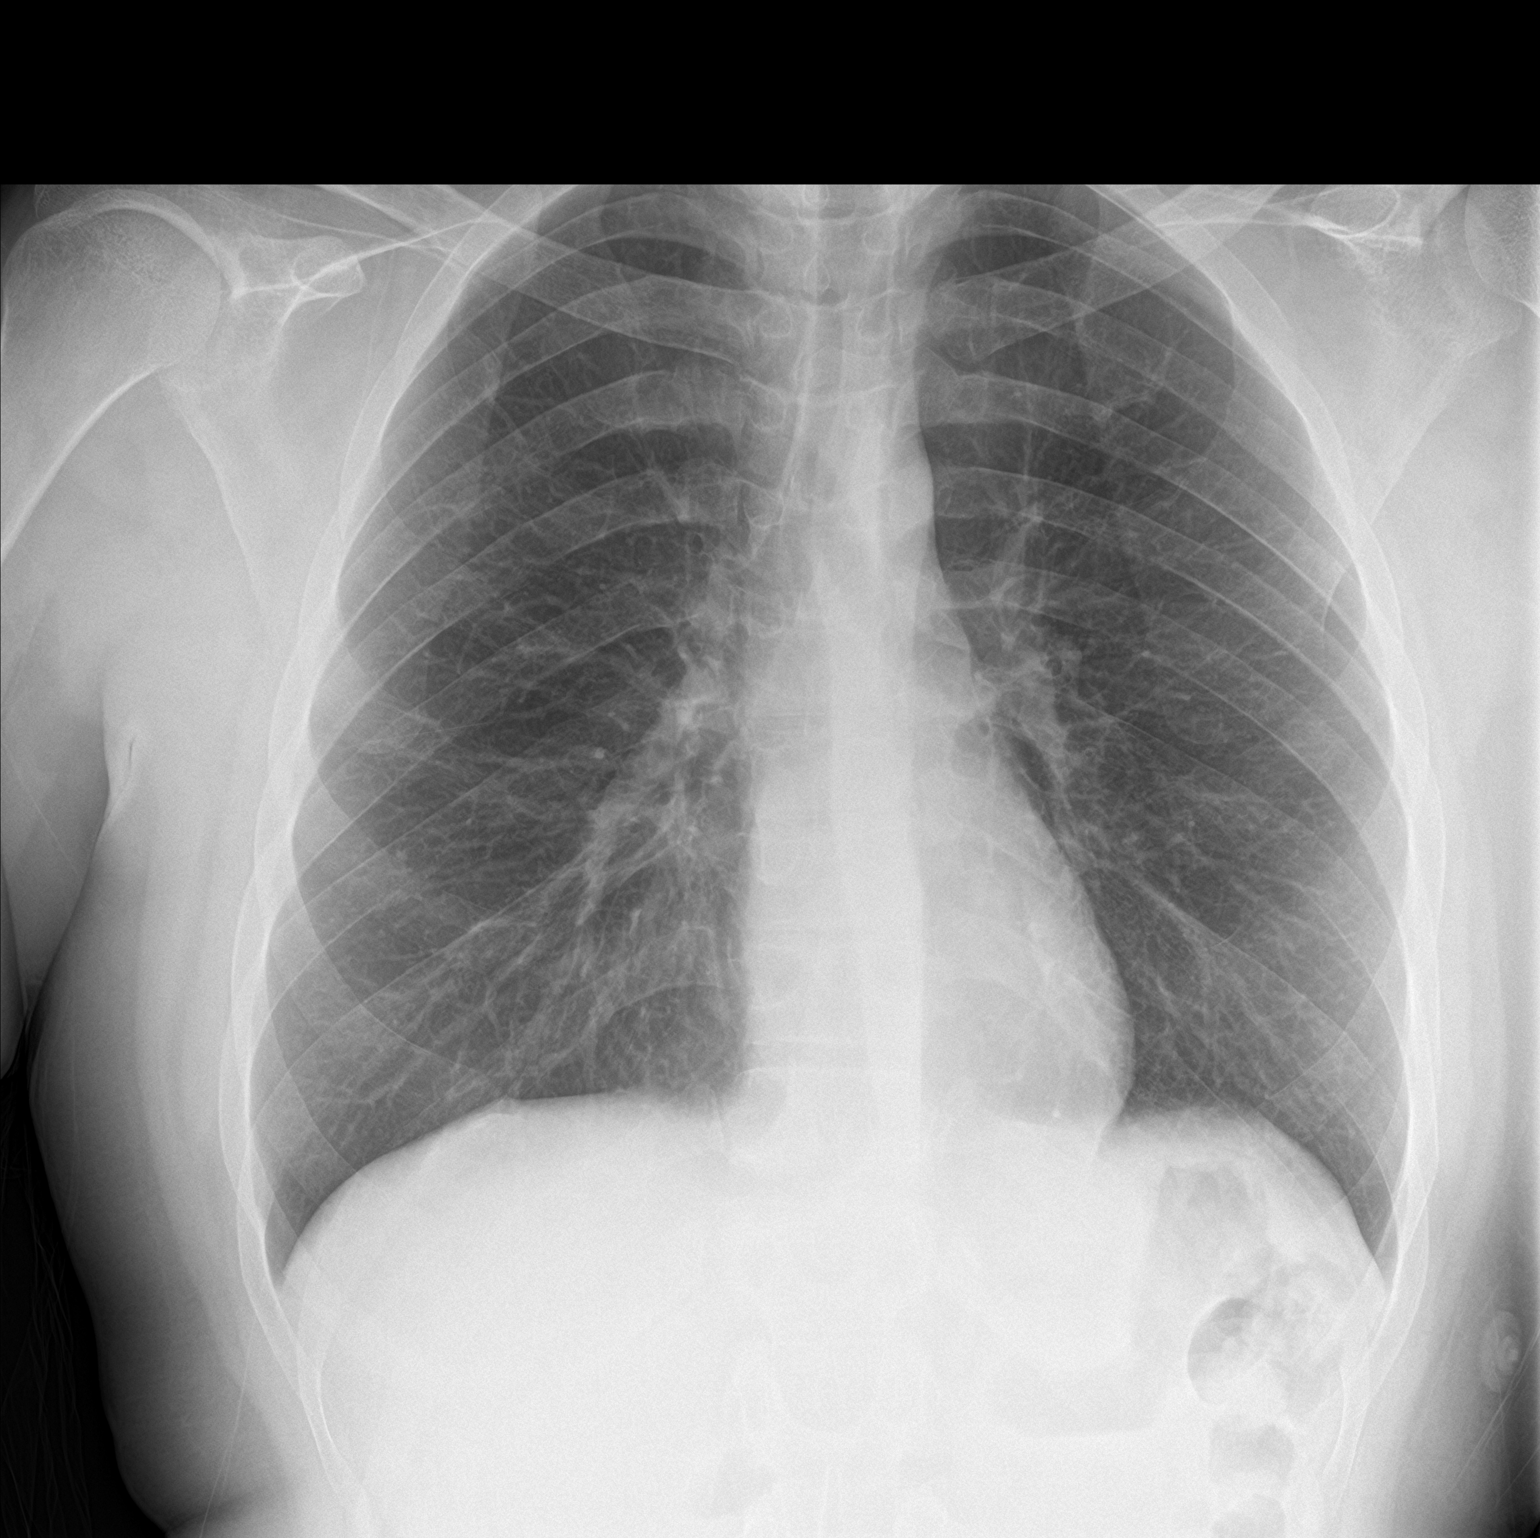

[chest lat]
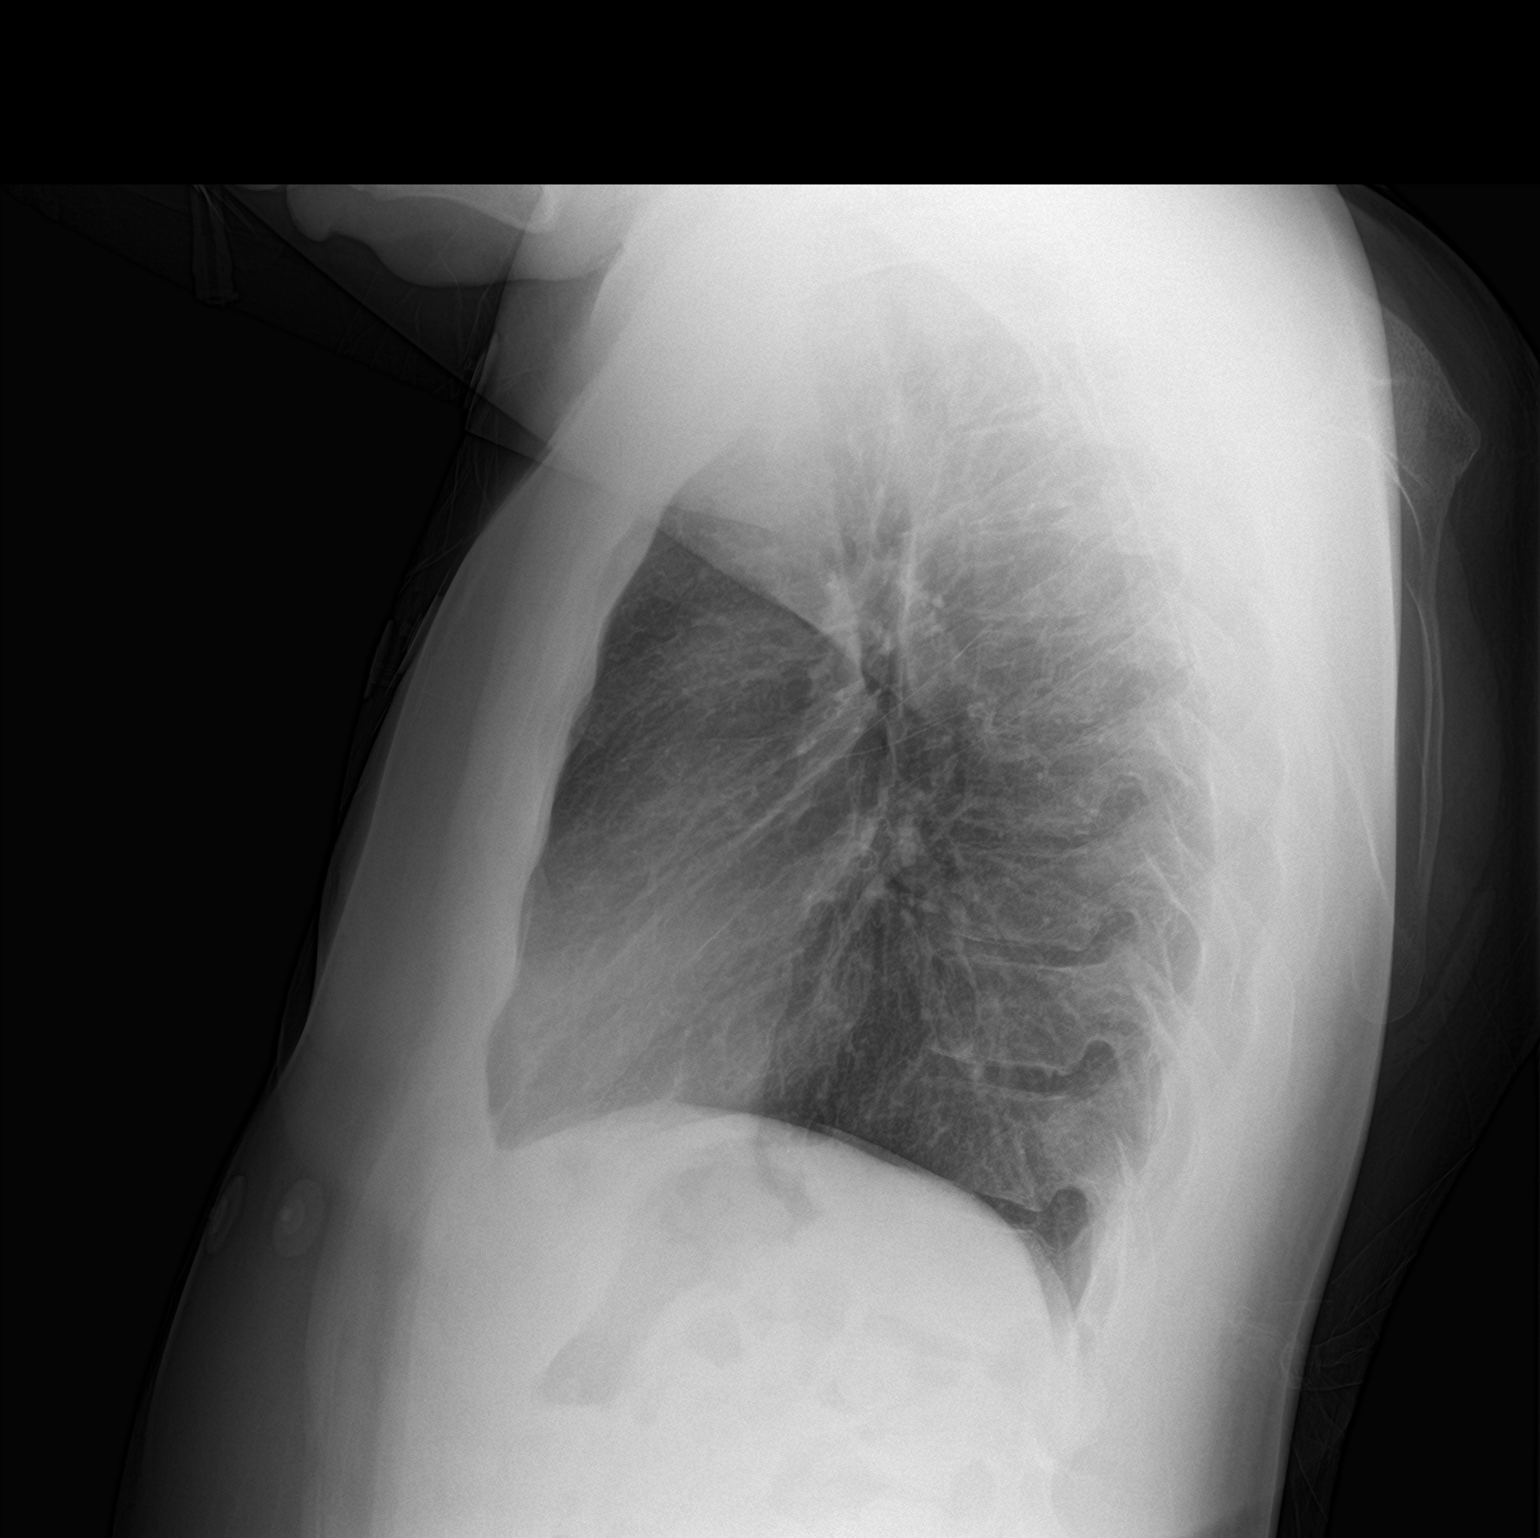

[2 of 2 positions shown; findings below may reference images not displayed]

FINDINGS: The lungs are clear. The pulmonary vasculature is normal. Heart size
is normal. Hilar and mediastinal contours are unremarkable. There is
no pleural effusion.
IMPRESSION: No active cardiopulmonary disease.

## 2023-11-06 ENCOUNTER — Encounter (HOSPITAL_COMMUNITY): Payer: Self-pay | Admitting: Emergency Medicine

## 2023-11-06 ENCOUNTER — Other Ambulatory Visit: Payer: Self-pay

## 2023-11-06 ENCOUNTER — Emergency Department (HOSPITAL_COMMUNITY): Payer: Self-pay

## 2023-11-06 ENCOUNTER — Emergency Department (HOSPITAL_COMMUNITY)
Admission: EM | Admit: 2023-11-06 | Discharge: 2023-11-06 | Disposition: A | Discharge status: Home / Self Care | Payer: Self-pay | Attending: Emergency Medicine | Admitting: Emergency Medicine

## 2023-11-06 DIAGNOSIS — R079 Chest pain, unspecified: Secondary | ICD-10-CM | POA: Insufficient documentation

## 2023-11-06 DIAGNOSIS — R10813 Right lower quadrant abdominal tenderness: Secondary | ICD-10-CM | POA: Insufficient documentation

## 2023-11-06 DIAGNOSIS — R10816 Epigastric abdominal tenderness: Secondary | ICD-10-CM | POA: Insufficient documentation

## 2023-11-06 LAB — COMPREHENSIVE METABOLIC PANEL
ALT: 20 U/L (ref 0–44)
AST: 15 U/L (ref 15–41)
Albumin: 3.2 g/dL — ABNORMAL LOW (ref 3.5–5.0)
Alkaline Phosphatase: 67 U/L (ref 38–126)
Anion gap: 6 (ref 5–15)
BUN: 11 mg/dL (ref 6–20)
CO2: 25 mmol/L (ref 22–32)
Calcium: 8.8 mg/dL — ABNORMAL LOW (ref 8.9–10.3)
Chloride: 109 mmol/L (ref 98–111)
Creatinine, Ser: 1.27 mg/dL — ABNORMAL HIGH (ref 0.61–1.24)
GFR, Estimated: >60 mL/min (ref >60)
Glucose, Bld: 84 mg/dL (ref 70–99)
Potassium: 4.2 mmol/L (ref 3.5–5.1)
Sodium: 140 mmol/L (ref 135–145)
Total Bilirubin: 0.5 mg/dL (ref <1.2)
Total Protein: 6.1 g/dL — ABNORMAL LOW (ref 6.5–8.1)

## 2023-11-06 LAB — URINALYSIS, ROUTINE W REFLEX MICROSCOPIC
Bacteria, UA: NONE SEEN
Bilirubin Urine: NEGATIVE
Glucose, UA: NEGATIVE mg/dL
Hgb urine dipstick: NEGATIVE
Ketones, ur: NEGATIVE mg/dL
Leukocytes,Ua: NEGATIVE
Nitrite: NEGATIVE
Protein, ur: NEGATIVE mg/dL
Specific Gravity, Urine: 1.021 (ref 1.005–1.030)
pH: 6 (ref 5.0–8.0)

## 2023-11-06 LAB — CBC WITH DIFFERENTIAL/PLATELET
Abs Immature Granulocytes: 0.02 10*3/uL (ref 0.00–0.07)
Basophils Absolute: 0.1 10*3/uL (ref 0.0–0.1)
Basophils Relative: 1 %
Eosinophils Absolute: 0.4 10*3/uL (ref 0.0–0.5)
Eosinophils Relative: 6 %
HCT: 39.3 % (ref 39.0–52.0)
Hemoglobin: 12.6 g/dL — ABNORMAL LOW (ref 13.0–17.0)
Immature Granulocytes: 0 %
Lymphocytes Relative: 32 %
Lymphs Abs: 2.3 10*3/uL (ref 0.7–4.0)
MCH: 28.7 pg (ref 26.0–34.0)
MCHC: 32.1 g/dL (ref 30.0–36.0)
MCV: 89.5 fL (ref 80.0–100.0)
Monocytes Absolute: 0.7 10*3/uL (ref 0.1–1.0)
Monocytes Relative: 10 %
Neutro Abs: 3.7 10*3/uL (ref 1.7–7.7)
Neutrophils Relative %: 51 %
Platelets: 272 10*3/uL (ref 150–400)
RBC: 4.39 MIL/uL (ref 4.22–5.81)
RDW: 13.8 % (ref 11.5–15.5)
WBC: 7.2 10*3/uL (ref 4.0–10.5)
nRBC: 0 % (ref 0.0–0.2)

## 2023-11-06 LAB — TROPONIN I (HIGH SENSITIVITY)
Troponin I (High Sensitivity): 3 ng/L (ref <18)
Troponin I (High Sensitivity): 3 ng/L (ref <18)

## 2023-11-06 LAB — LIPASE, BLOOD: Lipase: 36 U/L (ref 11–51)

## 2023-11-06 MED ORDER — RIVAROXABAN (XARELTO) VTE STARTER PACK (15 & 20 MG)
ORAL_TABLET | ORAL | 0 refills | Status: DC
  Filled 2023-11-06: qty 51, 28d supply, fill #0

## 2023-11-06 MED ORDER — IOHEXOL 350 MG/ML SOLN
75.0000 mL | Freq: Once | INTRAVENOUS | Status: AC | PRN
  Administered 2023-11-06: 75 mL via INTRAVENOUS

## 2023-11-06 NOTE — ED Notes (Signed)
Please call mother Shanda Bumps when results and any updates come in

## 2023-11-06 NOTE — Discharge Instructions (Addendum)
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  You are being restarted on Xarelto which is a blood thinner for this.  Your CT scan did not show any abnormality in your belly.  You need to follow-up with a primary care doctor within the next month so they can decide how long you need to be on blood thinners.  If you develop worsening chest pain, abdominal pain, difficulty breathing or any other new concerning symptoms you should return to the ED.

## 2023-11-06 NOTE — ED Provider Notes (Signed)
[place] EMERGENCY DEPARTMENT AT [facility] Provider Note   CSN: [id number] Arrival date & time: 11/06/23  1919     History  Chief Complaint  Patient presents with   Chest Pain    Craig Daugherty is a 42 y.o. male.   Chest Pain 42 year old male history of prior PE no longer anticoagulation presenting with chest pain.  He was eating tonight and then shortly after developed some chest pain.  Chest pain is substernal, goes to his left upper abdomen and into his epigastric region.  He has not any nausea or vomiting.  Currently chest pain has resolved.  No shortness of breath or pleuritic pain.  Feels different than his prior PE.  No recent lower extremity edema or immobilization or travel.  No diarrhea.  No fevers or chills.     Home Medications Prior to Admission medications   Medication Sig Start Date End Date Taking? Authorizing Provider  RIVAROXABAN [name]) VTE STARTER PACK (15 & 20 MG) Follow package directions: Take one 15mg  tablet by mouth twice a day. On day 22, switch to one 20mg  tablet once a day. Take with food. 11/06/23  Yes Laurence Spates, MD  albuterol (PROVENTIL HFA;VENTOLIN HFA) 108 (90 Base) MCG/ACT inhaler Inhale 1-2 puffs into the lungs every 6 (six) hours as needed for wheezing or shortness of breath.    [provider]      Allergies    Patient has no known allergies.    Review of Systems   Review of Systems  Cardiovascular:  Positive for chest pain.  Review of systems completed and notable as per HPI.  ROS otherwise negative.   Physical Exam Updated Vital Signs BP 120/76   Pulse 70   Temp 98.9 F (37.2 C) (Oral)   Resp 15   Wt 122.5 kg   SpO2 99%   BMI 29.67 kg/m  Physical Exam Vitals and nursing note reviewed.  Constitutional:      General: He is not in acute distress.    Appearance: He is well-developed.  HENT:     Head: Normocephalic and atraumatic.     Nose: Nose normal.     Mouth/Throat:     Mouth: Mucous  membranes are moist.     Pharynx: Oropharynx is clear.  Eyes:     Extraocular Movements: Extraocular movements intact.     Conjunctiva/sclera: Conjunctivae normal.     Pupils: Pupils are equal, round, and reactive to light.  Cardiovascular:     Rate and Rhythm: Normal rate and regular rhythm.     Pulses: Normal pulses.     Heart sounds: Normal heart sounds. No murmur heard. Pulmonary:     Effort: Pulmonary effort is normal. No respiratory distress.     Breath sounds: Normal breath sounds.  Abdominal:     Palpations: Abdomen is soft.     Tenderness: There is abdominal tenderness. There is no right CVA tenderness, left CVA tenderness, guarding or rebound.     Comments: Tenderness in the epigastrium and right lower quadrant  Musculoskeletal:        General: No swelling.     Cervical back: Neck supple.     Right lower leg: No edema.     Left lower leg: No edema.  Skin:    General: Skin is warm and dry.     Capillary Refill: Capillary refill takes less than 2 seconds.  Neurological:     General: No focal deficit present.     Mental  Status: He is alert and oriented to person, place, and time. Mental status is at baseline.  Psychiatric:        Mood and Affect: Mood normal.     ED Results / Procedures / Treatments   Labs (all labs ordered are listed, but only abnormal results are displayed) Labs Reviewed  COMPREHENSIVE METABOLIC PANEL - Abnormal; Notable for the following components:      Result Value   Creatinine, Ser 1.27 (*)    Calcium 8.8 (*)    Total Protein 6.1 (*)    Albumin 3.2 (*)    All other components within normal limits  CBC WITH DIFFERENTIAL/PLATELET - Abnormal; Notable for the following components:   Hemoglobin 12.6 (*)    All other components within normal limits  URINALYSIS, ROUTINE W REFLEX MICROSCOPIC - Abnormal; Notable for the following components:   Color, Urine STRAW (*)    All other components within normal limits  LIPASE, BLOOD  TROPONIN I (HIGH  SENSITIVITY)  TROPONIN I (HIGH SENSITIVITY)    EKG EKG Interpretation Date/Time:  Thursday November 06 2023 19:23:54 EST Ventricular Rate:  69 PR Interval:  150 QRS Duration:  83 QT Interval:  383 QTC Calculation: 411 R Axis:   71  Text Interpretation: Sinus rhythm ST elevation inferolaterally with J-point notching, appears grossly unchanged from prior EKG favor early repolarization Confirmed by Fulton Reek [phone]) on 11/06/2023 7:45:37 PM  Radiology CT Angio Chest PE W/Cm &/Or Wo Cm  Result Date: 11/06/2023 CLINICAL DATA:  Pulmonary embolism (PE), high prob; chest pain, dyspnea, RLQ abdominal pain EXAM: CT ANGIOGRAPHY CHEST CT ABDOMEN AND PELVIS WITH CONTRAST TECHNIQUE: Multidetector CT imaging of the chest was performed using the standard protocol during bolus administration of intravenous contrast. Multiplanar CT image reconstructions and MIPs were obtained to evaluate the vascular anatomy. Multidetector CT imaging of the abdomen and pelvis was performed using the standard protocol during bolus administration of intravenous contrast. RADIATION DOSE REDUCTION: This exam was performed according to the departmental dose-optimization program which includes automated exposure control, adjustment of the mA and/or kV according to patient size and/or use of iterative reconstruction technique. CONTRAST:  75mL OMNIPAQUE IOHEXOL 350 MG/ML SOLN COMPARISON:  None Available. FINDINGS: CTA CHEST FINDINGS Cardiovascular: Adequate opacification of the pulmonary arterial tree. No intraluminal filling defect identified to suggest acute pulmonary embolism. There is an eccentric filling defect within the right lower lobar peripheral pulmonary artery seen on image # 75/3 keeping with chronic pulmonary embolus. Central pulmonary arteries are of normal caliber. No significant coronary artery calcification. Cardiac size within normal limits. No pericardial effusion. No significant atherosclerotic calcification  within the thoracic aorta. No aortic aneurysm. Mediastinum/Nodes: No enlarged mediastinal, hilar, or axillary lymph nodes. Thyroid gland, trachea, and esophagus demonstrate no significant findings. Lungs/Pleura: Imaging is limited by respiratory motion artifact. No focal pulmonary infiltrate or nodule. No pneumothorax or pleural effusion. No central obstructing lesion. There is mild diffuse bronchial wall thickening present with scattered areas of airway impaction best appreciated within the left lower lobe in keeping with airway inflammation. Musculoskeletal: No chest wall abnormality. No acute or significant osseous findings. Review of the MIP images confirms the above findings. CT ABDOMEN and PELVIS FINDINGS Hepatobiliary: No focal liver abnormality is seen. No gallstones, gallbladder wall thickening, or biliary dilatation. Pancreas: Unremarkable. No pancreatic ductal dilatation or surrounding inflammatory changes. Spleen: Unremarkable Adrenals/Urinary Tract: Adrenal glands are unremarkable. Kidneys are normal, without renal calculi, focal lesion, or hydronephrosis. Bladder is unremarkable. Stomach/Bowel: Stomach is within normal limits.  Appendix appears normal. No evidence of bowel wall thickening, distention, or inflammatory changes. Vascular/Lymphatic: No significant vascular findings are present. No enlarged abdominal or pelvic lymph nodes. Reproductive: Prostate is unremarkable. Other: No abdominal wall hernia or abnormality. No abdominopelvic ascites. Musculoskeletal: No acute or significant osseous findings. Review of the MIP images confirms the above findings. IMPRESSION: 1. No acute pulmonary embolism. Small chronic pulmonary embolus noted within the right lower lobe. 2. Mild diffuse bronchial wall thickening with scattered areas of airway impaction in keeping with airway inflammation. No focal pulmonary infiltrate. 3. No acute intra-abdominal pathology identified.  Normal appendix. Electronically Signed    By: Helyn Numbers M.D.   On: 11/06/2023 22:53   CT ABDOMEN PELVIS W CONTRAST  Result Date: 11/06/2023 CLINICAL DATA:  Pulmonary embolism (PE), high prob; chest pain, dyspnea, RLQ abdominal pain EXAM: CT ANGIOGRAPHY CHEST CT ABDOMEN AND PELVIS WITH CONTRAST TECHNIQUE: Multidetector CT imaging of the chest was performed using the standard protocol during bolus administration of intravenous contrast. Multiplanar CT image reconstructions and MIPs were obtained to evaluate the vascular anatomy. Multidetector CT imaging of the abdomen and pelvis was performed using the standard protocol during bolus administration of intravenous contrast. RADIATION DOSE REDUCTION: This exam was performed according to the departmental dose-optimization program which includes automated exposure control, adjustment of the mA and/or kV according to patient size and/or use of iterative reconstruction technique. CONTRAST:  75mL OMNIPAQUE IOHEXOL 350 MG/ML SOLN COMPARISON:  None Available. FINDINGS: CTA CHEST FINDINGS Cardiovascular: Adequate opacification of the pulmonary arterial tree. No intraluminal filling defect identified to suggest acute pulmonary embolism. There is an eccentric filling defect within the right lower lobar peripheral pulmonary artery seen on image # 75/3 keeping with chronic pulmonary embolus. Central pulmonary arteries are of normal caliber. No significant coronary artery calcification. Cardiac size within normal limits. No pericardial effusion. No significant atherosclerotic calcification within the thoracic aorta. No aortic aneurysm. Mediastinum/Nodes: No enlarged mediastinal, hilar, or axillary lymph nodes. Thyroid gland, trachea, and esophagus demonstrate no significant findings. Lungs/Pleura: Imaging is limited by respiratory motion artifact. No focal pulmonary infiltrate or nodule. No pneumothorax or pleural effusion. No central obstructing lesion. There is mild diffuse bronchial wall thickening present  with scattered areas of airway impaction best appreciated within the left lower lobe in keeping with airway inflammation. Musculoskeletal: No chest wall abnormality. No acute or significant osseous findings. Review of the MIP images confirms the above findings. CT ABDOMEN and PELVIS FINDINGS Hepatobiliary: No focal liver abnormality is seen. No gallstones, gallbladder wall thickening, or biliary dilatation. Pancreas: Unremarkable. No pancreatic ductal dilatation or surrounding inflammatory changes. Spleen: Unremarkable Adrenals/Urinary Tract: Adrenal glands are unremarkable. Kidneys are normal, without renal calculi, focal lesion, or hydronephrosis. Bladder is unremarkable. Stomach/Bowel: Stomach is within normal limits. Appendix appears normal. No evidence of bowel wall thickening, distention, or inflammatory changes. Vascular/Lymphatic: No significant vascular findings are present. No enlarged abdominal or pelvic lymph nodes. Reproductive: Prostate is unremarkable. Other: No abdominal wall hernia or abnormality. No abdominopelvic ascites. Musculoskeletal: No acute or significant osseous findings. Review of the MIP images confirms the above findings. IMPRESSION: 1. No acute pulmonary embolism. Small chronic pulmonary embolus noted within the right lower lobe. 2. Mild diffuse bronchial wall thickening with scattered areas of airway impaction in keeping with airway inflammation. No focal pulmonary infiltrate. 3. No acute intra-abdominal pathology identified.  Normal appendix. Electronically Signed   By: Helyn Numbers M.D.   On: 11/06/2023 22:53    Procedures Procedures    Medications Ordered  in ED Medications  iohexol (OMNIPAQUE) 350 MG/ML injection 75 mL (75 mLs Intravenous Contrast Given 11/06/23 2228)    ED Course/ Medical Decision Making/ A&P                                 Medical Decision Making Amount and/or Complexity of Data Reviewed Labs: ordered. Radiology: ordered.  Risk Prescription  drug management.   Medical Decision Making:   Reeder Proa is a 42 y.o. male who presented to the ED today with chest pain, abdominal pain.  Vital signs reviewed.  EKG shows some ST elevation with J-point notching inferolaterally, appears unchanged from his prior EKGs I favor early repolarization especially given no chest pain currently.  Will evaluate for ACS with possible troponin, he does smoke.  He has history of PE as well obtain CTA chest presents with tenderness abdomen, evaluate for possible biliary pathology, obstruction, appendicitis.   Patient placed on continuous vitals and telemetry monitoring while in ED which was reviewed periodically.  Reviewed and confirmed nursing documentation for past medical history, family history, social history.  Reassessment and Plan:   CT scan reviewed, no signs of acute PE but does have findings concerning for chronic PE.  Will order an anticoagulation.  Discussed with him, will restart Xarelto for him.  He has no findings in his abdomen acutely.  His pain is improved and he is tolerating p.o.  Lab work so far is reassuring.  Initial troponin normal, repeat troponin is pending.  I gave handoff to PA Sanders with plan to follow-up repeat troponin and likely discharge if stable.   Patient's presentation is most consistent with acute complicated illness / injury requiring diagnostic workup.           Final Clinical Impression(s) / ED Diagnoses Final diagnoses:  Nonspecific chest pain    Rx / DC Orders ED Discharge Orders          Ordered    RIVAROXABAN (XARELTO) VTE STARTER PACK (15 & 20 MG)        11/06/23 2338              Laurence Spates, MD 11/07/23 1610

## 2023-11-06 NOTE — ED Triage Notes (Signed)
Pt in with sudden L sided chest pressure that began at 6pm while laying in a chair. Hx of PE in 2017, stopped taking Xarelto in 19'. Reports initial nausea and sob, since resolved, arrives 7/10 pain. Does endorse ETOH use  VS en route: 113/65 68HR 98%RA 18RR CBG 121

## 2023-11-07 ENCOUNTER — Other Ambulatory Visit (HOSPITAL_COMMUNITY): Payer: Self-pay

## 2023-11-08 ENCOUNTER — Other Ambulatory Visit (HOSPITAL_COMMUNITY): Payer: Self-pay

## 2023-11-17 ENCOUNTER — Other Ambulatory Visit (HOSPITAL_COMMUNITY): Payer: Self-pay

## 2023-11-21 ENCOUNTER — Encounter (HOSPITAL_COMMUNITY): Payer: Self-pay

## 2023-11-21 ENCOUNTER — Other Ambulatory Visit (HOSPITAL_COMMUNITY): Payer: Self-pay

## 2023-11-21 ENCOUNTER — Telehealth: Payer: Self-pay | Admitting: *Deleted

## 2023-11-21 ENCOUNTER — Ambulatory Visit (HOSPITAL_COMMUNITY)
Admission: EM | Admit: 2023-11-21 | Discharge: 2023-11-21 | Disposition: A | Discharge status: Home / Self Care | Payer: Self-pay | Attending: Family Medicine | Admitting: Family Medicine

## 2023-11-21 DIAGNOSIS — R0602 Shortness of breath: Secondary | ICD-10-CM

## 2023-11-21 MED ORDER — ALBUTEROL SULFATE HFA 108 (90 BASE) MCG/ACT IN AERS
1.0000 | INHALATION_SPRAY | Freq: Four times a day (QID) | RESPIRATORY_TRACT | 1 refills | Status: DC | PRN
  Filled 2023-11-21: qty 6.7, 20d supply, fill #0
  Filled 2024-03-01 (×3): qty 6.7, 20d supply, fill #1

## 2023-11-21 NOTE — ED Triage Notes (Signed)
Patient here today with c/o SOB and tightness in his chest X 1 day. Patient had PE last month. Patient would like rx for Albuterol.

## 2023-11-21 NOTE — ED Provider Notes (Signed)
[facility]    CSN: [id number] Arrival date & time: 11/21/23  1307      History   Chief Complaint Chief Complaint  Patient presents with   Shortness of Breath    HPI Craig Daugherty is a 42 y.o. male.    Shortness of Breath Associated symptoms: no cough and no wheezing    Patient is here for sob, chest tightness for about a week.  He was just dx with PE several weeks ago.  Scripts for [employer] and were supposed to have been sent.  He did pick up the xarelto, but the inhaler never made it.   No major URI symptoms.  He is not in any distress or trouble speaking/moving.    Upon review of chart, CT showed chronic PE, nothing acute, and did show diffuse bronchial wall thickening, airway inflammation.        Past Medical History:  Diagnosis Date   Pulmonary embolism [facility])     Patient Active Problem List   Diagnosis Date Noted   Absolute anemia 01/03/2016   Family history of diabetes mellitus 01/03/2016   Tobacco abuse    Chest pain, pleuritic    Pulmonary embolism (HCC) 11/27/2015    History reviewed. No pertinent surgical history.     Home Medications    Prior to Admission medications   Medication Sig Start Date End Date Taking? Authorizing Provider  albuterol (PROVENTIL HFA;VENTOLIN HFA) 108 (90 Base) MCG/ACT inhaler Inhale 1-2 puffs into the lungs every 6 (six) hours as needed for wheezing or shortness of breath.    [provider]  RIVAROXABAN [name]) VTE STARTER PACK (15 & 20 MG) Follow package directions: Take one 15mg  tablet by mouth twice a day. On day 22, switch to one 20mg  tablet once a day. Take with food. 11/06/23   Laurence Spates, MD    Family History History reviewed. No pertinent family history.  Social History Social History   Tobacco Use   Smoking status: Every Day   Smokeless tobacco: Never  Substance Use Topics   Alcohol use: Yes    Comment: OCCASIONAL   Drug use: Yes    Types: Marijuana      Allergies   Tramadol   Review of Systems Review of Systems  Constitutional: Negative.   HENT: Negative.    Respiratory:  Positive for shortness of breath. Negative for cough and wheezing.   Cardiovascular: Negative.   Gastrointestinal: Negative.   Musculoskeletal: Negative.   Hematological: Negative.   Psychiatric/Behavioral: Negative.       Physical Exam Triage Vital Signs ED Triage Vitals  Encounter Vitals Group     BP 11/21/23 1331 133/79     Systolic BP Percentile --      Diastolic BP Percentile --      Pulse Rate 11/21/23 1331 63     Resp 11/21/23 1331 16     Temp 11/21/23 1331 98.2 F (36.8 C)     Temp Source 11/21/23 1331 Oral     SpO2 11/21/23 1331 96 %     Weight 11/21/23 1331 260 lb (117.9 kg)     Height 11/21/23 1331 6\' 7"  (2.007 m)     Head Circumference --      Peak Flow --      Pain Score 11/21/23 1330 5     Pain Loc --      Pain Education --      Exclude from Growth Chart --    No data found.  Updated Vital Signs BP 133/79 (BP Location: Left Arm)   Pulse 63   Temp 98.2 F (36.8 C) (Oral)   Resp 16   Ht 6\' 7"  (2.007 m)   Wt 117.9 kg   SpO2 96%   BMI 29.29 kg/m   Visual Acuity Right Eye Distance:   Left Eye Distance:   Bilateral Distance:    Right Eye Near:   Left Eye Near:    Bilateral Near:     Physical Exam Constitutional:      General: He is not in acute distress.    Appearance: He is well-developed and normal weight. He is not ill-appearing, toxic-appearing or diaphoretic.  Cardiovascular:     Rate and Rhythm: Normal rate and regular rhythm.  Pulmonary:     Effort: Pulmonary effort is normal.     Breath sounds: Normal breath sounds. No decreased breath sounds, wheezing, rhonchi or rales.  Skin:    General: Skin is warm.  Neurological:     General: No focal deficit present.     Mental Status: He is alert.  Psychiatric:        Mood and Affect: Mood normal.      UC Treatments / Results  Labs (all labs ordered  are listed, but only abnormal results are displayed) Labs Reviewed - No data to display  EKG   Radiology No results found.  Procedures Procedures (including critical care time)  Medications Ordered in UC Medications - No data to display  Initial Impression / Assessment and Plan / UC Course  I have reviewed the triage vital signs and the nursing notes.  Pertinent labs & imaging results that were available during my care of the patient were reviewed by me and considered in my medical decision making (see chart for details).   Patient is here for continued feeling of sob.  He was seen in the ER 11/28, dx with chronic PE and he is taking xarelto.  HE is here today for the script of albuterol, which never made it to the pharmacy.  Denies any other new/worrisome symptoms.   VSS, in no distress today.  I have refilled the inhaler, and advised to f/u with pulmonary.   Final Clinical Impressions(s) / UC Diagnoses   Final diagnoses:  SOB (shortness of breath)     Discharge Instructions      You were seen today for continued shortness of breath.  Your vitals and exam overall look good. I have sent out the albuterol inhaler for you today.  You should follow up with a primary care provider and see a pulmonologist as well.  You may call [facility] at [phone].     ED Prescriptions     Medication Sig Dispense Auth. Provider   albuterol (VENTOLIN HFA) 108 (90 Base) MCG/ACT inhaler Inhale 1-2 puffs into the lungs every 6 (six) hours as needed for wheezing or shortness of breath. 6.7 g Jannifer Franklin, MD      PDMP not reviewed this encounter.   Jannifer Franklin, MD 11/21/23 [phone]

## 2023-11-21 NOTE — Discharge Instructions (Signed)
You were seen today for continued shortness of breath.  Your vitals and exam overall look good. I have sent out the albuterol inhaler for you today.  You should follow up with a primary care provider and see a pulmonologist as well.  You may call [facility] at [phone].

## 2023-11-21 NOTE — Telephone Encounter (Signed)
PT mom called regarding albuterol Rx.  RNCM reviewed chart and noted that albuterol is on home medication list, but it was not given during last admission and EDP did not write for refill.  Pt mom states he can not establish with PCP until mid January and is in need of inhaler.  RNCM advised to have him return as I can not call in Rx without order and the EDP that treated him is not here to give a verbal.

## 2023-11-25 ENCOUNTER — Other Ambulatory Visit (HOSPITAL_COMMUNITY): Payer: Self-pay

## 2023-11-25 ENCOUNTER — Other Ambulatory Visit: Payer: Self-pay

## 2023-11-25 DIAGNOSIS — R5383 Other fatigue: Secondary | ICD-10-CM | POA: Diagnosis not present

## 2023-11-25 DIAGNOSIS — I2609 Other pulmonary embolism with acute cor pulmonale: Secondary | ICD-10-CM | POA: Diagnosis not present

## 2023-11-25 DIAGNOSIS — Z7901 Long term (current) use of anticoagulants: Secondary | ICD-10-CM | POA: Diagnosis not present

## 2023-11-25 DIAGNOSIS — R0602 Shortness of breath: Secondary | ICD-10-CM | POA: Diagnosis not present

## 2023-11-25 DIAGNOSIS — Z833 Family history of diabetes mellitus: Secondary | ICD-10-CM | POA: Diagnosis not present

## 2023-11-25 DIAGNOSIS — Z832 Family history of diseases of the blood and blood-forming organs and certain disorders involving the immune mechanism: Secondary | ICD-10-CM | POA: Diagnosis not present

## 2023-11-25 MED ORDER — XARELTO 20 MG PO TABS
ORAL_TABLET | ORAL | 0 refills | Status: DC
  Filled 2023-11-25: qty 30, 30d supply, fill #0
  Filled 2024-01-22: qty 30, 30d supply, fill #1
  Filled 2024-03-01 (×3): qty 30, 30d supply, fill #2

## 2023-11-25 MED ORDER — FLUTICASONE FUROATE-VILANTEROL 100-25 MCG/ACT IN AEPB
INHALATION_SPRAY | RESPIRATORY_TRACT | 11 refills | Status: DC
  Filled 2023-11-25: qty 60, 30d supply, fill #0

## 2023-11-26 ENCOUNTER — Other Ambulatory Visit (HOSPITAL_COMMUNITY): Payer: Self-pay

## 2023-11-26 MED ORDER — FLUTICASONE-SALMETEROL 100-50 MCG/ACT IN AEPB
INHALATION_SPRAY | RESPIRATORY_TRACT | 1 refills | Status: DC
  Filled 2023-11-26: qty 60, 30d supply, fill #0
  Filled 2024-01-22: qty 60, 30d supply, fill #1

## 2023-11-27 ENCOUNTER — Other Ambulatory Visit (HOSPITAL_COMMUNITY): Payer: Self-pay

## 2023-11-27 MED ORDER — VITAMIN D (ERGOCALCIFEROL) 1.25 MG (50000 UNIT) PO CAPS
50000.0000 [IU] | ORAL_CAPSULE | ORAL | 3 refills | Status: DC
  Filled 2023-11-27: qty 12, 84d supply, fill #0
  Filled 2023-12-23 – 2024-03-01 (×4): qty 12, 84d supply, fill #1
  Filled 2024-06-04: qty 12, 84d supply, fill #2
  Filled 2024-09-08: qty 12, 84d supply, fill #3

## 2023-11-28 ENCOUNTER — Other Ambulatory Visit (HOSPITAL_COMMUNITY): Payer: Self-pay

## 2023-12-23 ENCOUNTER — Other Ambulatory Visit (HOSPITAL_COMMUNITY): Payer: Self-pay

## 2024-01-21 ENCOUNTER — Institutional Professional Consult (permissible substitution): Payer: Medicaid Other | Admitting: Pulmonary Disease

## 2024-01-21 ENCOUNTER — Encounter: Payer: Self-pay | Admitting: Pulmonary Disease

## 2024-01-22 ENCOUNTER — Other Ambulatory Visit (HOSPITAL_COMMUNITY): Payer: Self-pay

## 2024-01-23 ENCOUNTER — Other Ambulatory Visit (HOSPITAL_COMMUNITY): Payer: Self-pay

## 2024-03-01 ENCOUNTER — Other Ambulatory Visit: Payer: Self-pay

## 2024-03-01 ENCOUNTER — Other Ambulatory Visit (HOSPITAL_COMMUNITY): Payer: Self-pay

## 2024-03-01 MED ORDER — FLUTICASONE-SALMETEROL 100-50 MCG/ACT IN AEPB
INHALATION_SPRAY | RESPIRATORY_TRACT | 1 refills | Status: DC
  Filled 2024-03-01: qty 60, 30d supply, fill #0
  Filled 2024-03-02: qty 60, 60d supply, fill #0

## 2024-03-02 ENCOUNTER — Other Ambulatory Visit: Payer: Self-pay

## 2024-03-02 ENCOUNTER — Other Ambulatory Visit (HOSPITAL_COMMUNITY): Payer: Self-pay

## 2024-03-17 ENCOUNTER — Encounter: Payer: Self-pay | Admitting: Pulmonary Disease

## 2024-03-17 ENCOUNTER — Ambulatory Visit: Payer: Medicaid Other | Admitting: Pulmonary Disease

## 2024-03-17 VITALS — BP 113/71 | HR 74 | Temp 97.5°F | Ht 79.0 in | Wt 296.8 lb

## 2024-03-17 DIAGNOSIS — Z86711 Personal history of pulmonary embolism: Secondary | ICD-10-CM | POA: Diagnosis not present

## 2024-03-17 DIAGNOSIS — R0600 Dyspnea, unspecified: Secondary | ICD-10-CM | POA: Diagnosis not present

## 2024-03-17 DIAGNOSIS — M7989 Other specified soft tissue disorders: Secondary | ICD-10-CM

## 2024-03-17 DIAGNOSIS — F17210 Nicotine dependence, cigarettes, uncomplicated: Secondary | ICD-10-CM

## 2024-03-17 MED ORDER — FLUTICASONE-SALMETEROL 250-50 MCG/ACT IN AEPB
1.0000 | INHALATION_SPRAY | Freq: Two times a day (BID) | RESPIRATORY_TRACT | 11 refills | Status: DC
Start: 2024-03-17 — End: 2024-06-21
  Filled 2024-03-17: qty 60, 30d supply, fill #0
  Filled 2024-06-04: qty 60, 30d supply, fill #1

## 2024-03-17 NOTE — Patient Instructions (Addendum)
 Use Advair twice a day  Use albuterol as needed  Ultrasound of the lower extremity to rule out blood clots  Echocardiogram for shortness of breath on exertion  Schedule for pulmonary function test  Follow-up in 6 to 8 weeks

## 2024-03-17 NOTE — Progress Notes (Signed)
 Craig Daugherty    [id number]    [birth year]  Primary Care Physician:Pcp, No  Referring Physician: No referring provider defined for this encounter.  Chief complaint:   Patient being seen for blood clots  HPI:  Patient with a history of pulmonary embolism last in November 2024 Had a previous blood clot in 2017 On review of his records from 2016 had had a history of blood clots prior to that as well he has some soreness in his legs with some swelling of his left leg Numbness in the legs hands, cramping  Shortness of breath on exertion  Has had some swelling of his left leg which has been for few months, he does not recollect having an ultrasound of his lower extremity recently  Previous blood clot did also involve clots in his legs  He is an active smoker smokes about 2 black and mild cigars a day he does have some chest pain and chest discomfort with shortness of breath when he is active increase humidity makes his symptoms worse He does have occasional wheezing  If he takes his time he can walk a mile  He remains currently on blood thinners  No pertinent occupational history  Currently uses Advair which he uses once a day  Outpatient Encounter Medications as of 03/17/2024  Medication Sig   albuterol (VENTOLIN HFA) 108 (90 Base) MCG/ACT inhaler Inhale 1-2 puffs into the lungs every 6 (six) hours as needed for wheezing or shortness of breath.   fluticasone-salmeterol (ADVAIR DISKUS) 100-50 MCG/ACT AEPB Inhale one puff into the lungs daily.   rivaroxaban (XARELTO) 20 MG TABS tablet Take one tablet (20 mg dose) by mouth daily.   Vitamin D, Ergocalciferol, (DRISDOL) 1.25 MG (50000 UNIT) CAPS capsule Take 1 capsule (50,000 Units total) by mouth once a week.   [DISCONTINUED] RIVAROXABAN (XARELTO) VTE STARTER PACK (15 & 20 MG) Follow package directions: Take one 15mg  tablet by mouth twice a day. On day 22, switch to one 20mg  tablet once a day. Take with food. (Patient not  taking: Reported on 03/17/2024)   No facility-administered encounter medications on file as of 03/17/2024.    Allergies as of 03/17/2024 - Review Complete 03/17/2024  Allergen Reaction Noted   Tramadol Itching 12/29/2017    Past Medical History:  Diagnosis Date   Pulmonary embolism (HCC)     No past surgical history on file.  No family history on file.  Social History   Socioeconomic History   Marital status: Single    Spouse name: Not on file   Number of children: Not on file   Years of education: Not on file   Highest education level: Not on file  Occupational History   Not on file  Tobacco Use   Smoking status: Some Days    Types: Cigarettes, Cigars   Smokeless tobacco: Never   Tobacco comments:    Black and mild cigars  Substance and Sexual Activity   Alcohol use: Yes    Comment: OCCASIONAL   Drug use: Yes    Types: Marijuana   Sexual activity: Not on file  Other Topics Concern   Not on file  Social History Narrative   Not on file   Social Drivers of Health   Financial Resource Strain: Low Risk  (12/23/2023)   Received from [facility]   Overall Financial Resource Strain (CARDIA)    Difficulty of Paying Living Expenses: Not hard at all  Food Insecurity: No Food  Insecurity (12/23/2023)   Received from [facility]   Hunger Vital Sign    Worried About Running Out of Food in the Last Year: Never true    Ran Out of Food in the Last Year: Never true  Transportation Needs: No Transportation Needs (12/23/2023)   Received from [facility] - [facility]    Lack of Transportation (Medical): No    Lack of Transportation (Non-Medical): No  Physical Activity: Not on file  Stress: Not on file  Social Connections: Not on file  Intimate Partner Violence: Not on file    Review of Systems  Respiratory:  Positive for cough and shortness of breath.   Cardiovascular:  Positive for leg swelling.    Vitals:   03/17/24 1513  BP: 113/71  Pulse: 74   Temp: (!) 97.5 F (36.4 C)  SpO2: 98%     Physical Exam Constitutional:      Appearance: Normal appearance.  HENT:     Head: Normocephalic.     Mouth/Throat:     Mouth: Mucous membranes are moist.  Eyes:     General: No scleral icterus. Cardiovascular:     Rate and Rhythm: Normal rate and regular rhythm.     Heart sounds: No murmur heard.    No friction rub.  Pulmonary:     Effort: No respiratory distress.     Breath sounds: No stridor. No wheezing or rhonchi.  Musculoskeletal:        General: Swelling (Left lower extremity swelling) present.     Cervical back: No rigidity or tenderness.     Left lower leg: Edema present.  Neurological:     Mental Status: He is alert.  Psychiatric:        Mood and Affect: Mood normal.    Data Reviewed: Most recent records from [facility] reviewed   most recent CT scan 11/06/2023 does reveal a small chronic pulmonary embolus in the right lower lobe  Bronchial wall thickening with scattered areas of airway impaction-I did review do CT myself  Assessment:  Shortness of breath on exertion  History of multiple blood clots - Currently on Xarelto which he is compliant with - Appears his had multiple blood clots and he needs to be on anticoagulation for the rest of his life  Active smoker - Smoking cessation counseling  Plan/Recommendations: Schedule for pulmonary function test  Schedule for echocardiogram  Schedule for venous Doppler of the lower extremities  Use albuterol as needed  Make sure he is using Advair twice a day  Will increase the Advair to Advair 250 from Advair 100, encouraged to use it twice a day, ensure to rinse after use  Graded activities as tolerated  Smoking cessation counseling  Virl Diamond MD [facility] 03/17/2024, 3:18 PM  CC: No ref. provider found

## 2024-03-18 ENCOUNTER — Other Ambulatory Visit (HOSPITAL_COMMUNITY): Payer: Self-pay

## 2024-03-24 ENCOUNTER — Telehealth: Payer: Self-pay

## 2024-03-24 DIAGNOSIS — E559 Vitamin D deficiency, unspecified: Secondary | ICD-10-CM | POA: Diagnosis not present

## 2024-03-24 NOTE — Telephone Encounter (Unsigned)
 Copied from CRM [id number]. Topic: Referral - Prior Authorization Question >> Mar 24, 2024  1:26 PM Hilton Lucky wrote: Reason for CRM: [facility] practice calling to request that the Ultrasound order in active request be noted if it needs a prior authorization or not. Please notate when able.

## 2024-03-25 NOTE — Telephone Encounter (Signed)
 Hi, Shakimah.  It looks like you obtained the auth for this patient - did you close the loop for this &  reach out to the [facility] practice to let them know?

## 2024-04-28 ENCOUNTER — Ambulatory Visit (HOSPITAL_COMMUNITY)
Admission: RE | Admit: 2024-04-28 | Discharge: 2024-04-28 | Disposition: A | Discharge status: Home / Self Care | Source: Ambulatory Visit | Attending: Pulmonary Disease | Admitting: Pulmonary Disease

## 2024-04-28 DIAGNOSIS — R0600 Dyspnea, unspecified: Secondary | ICD-10-CM | POA: Diagnosis not present

## 2024-04-28 DIAGNOSIS — R079 Chest pain, unspecified: Secondary | ICD-10-CM | POA: Insufficient documentation

## 2024-04-28 DIAGNOSIS — Z86711 Personal history of pulmonary embolism: Secondary | ICD-10-CM | POA: Diagnosis not present

## 2024-04-28 DIAGNOSIS — R0609 Other forms of dyspnea: Secondary | ICD-10-CM

## 2024-04-28 LAB — ECHOCARDIOGRAM COMPLETE
AR max vel: 3.52 cm2
AV Peak grad: 7.5 mmHg
Ao pk vel: 1.37 m/s
Area-P 1/2: 2.48 cm2
MV VTI: 3.31 cm2
S' Lateral: 3.1 cm

## 2024-05-17 ENCOUNTER — Ambulatory Visit (HOSPITAL_COMMUNITY)
Admission: RE | Admit: 2024-05-17 | Discharge: 2024-05-17 | Disposition: A | Discharge status: Home / Self Care | Source: Ambulatory Visit | Attending: Pulmonary Disease | Admitting: Pulmonary Disease

## 2024-05-17 DIAGNOSIS — R0600 Dyspnea, unspecified: Secondary | ICD-10-CM | POA: Diagnosis not present

## 2024-05-17 DIAGNOSIS — Z86711 Personal history of pulmonary embolism: Secondary | ICD-10-CM

## 2024-05-17 DIAGNOSIS — M7989 Other specified soft tissue disorders: Secondary | ICD-10-CM

## 2024-05-17 NOTE — Progress Notes (Signed)
 VASCULAR LAB    Bilateral lower extremity venous has been performed.  See CV proc for preliminary results.   Journei Thomassen, RVT 05/17/2024, 11:03 AM

## 2024-05-26 ENCOUNTER — Telehealth: Payer: Self-pay

## 2024-05-26 NOTE — Telephone Encounter (Signed)
 Spoke with patient's mom Camilo Cella regarding to get in touch with patient. Camilo Cella is not on the patient's DPR .Patient has changed cell number [phone] and LVM for patient to call our office back .

## 2024-05-26 NOTE — Telephone Encounter (Signed)
 Copied from CRM [id number]. Topic: General - Other >> May 24, 2024  4:14 PM Tyronne Galloway wrote: Reason for CRM: Pt's mother Camilo Cella is calling due to the pt having to quit his job due to blood clots in his legs unable to remain standing for long periods of time. Camilo Cella stated there's a crisis prevention center willing to pay his electric bill if he can obtain a medical statement showing he has a life threatening medical illness. Pt needs this from Dr. Gaynell Keeler. The best phone number is [phone]. >> May 26, 2024 11:44 AM Juliana Ocean wrote: Pt's mother calling back.  She states this information is time sensitive.  She states he only has 2 days left.  She states she was promised to get a call back the next day.  She is upset this was not addressed.  She would like callback today.

## 2024-06-04 ENCOUNTER — Other Ambulatory Visit (HOSPITAL_COMMUNITY): Payer: Self-pay

## 2024-06-04 ENCOUNTER — Other Ambulatory Visit: Payer: Self-pay

## 2024-06-09 ENCOUNTER — Other Ambulatory Visit (HOSPITAL_COMMUNITY): Payer: Self-pay

## 2024-06-21 ENCOUNTER — Other Ambulatory Visit (HOSPITAL_COMMUNITY): Payer: Self-pay

## 2024-06-21 ENCOUNTER — Ambulatory Visit (INDEPENDENT_AMBULATORY_CARE_PROVIDER_SITE_OTHER): Admitting: Pulmonary Disease

## 2024-06-21 ENCOUNTER — Ambulatory Visit: Admitting: Pulmonary Disease

## 2024-06-21 VITALS — BP 116/83 | HR 110 | Ht 78.0 in | Wt 297.0 lb

## 2024-06-21 DIAGNOSIS — Z86711 Personal history of pulmonary embolism: Secondary | ICD-10-CM | POA: Diagnosis not present

## 2024-06-21 DIAGNOSIS — R0602 Shortness of breath: Secondary | ICD-10-CM

## 2024-06-21 DIAGNOSIS — J984 Other disorders of lung: Secondary | ICD-10-CM | POA: Diagnosis not present

## 2024-06-21 DIAGNOSIS — F17210 Nicotine dependence, cigarettes, uncomplicated: Secondary | ICD-10-CM

## 2024-06-21 DIAGNOSIS — R0600 Dyspnea, unspecified: Secondary | ICD-10-CM | POA: Diagnosis not present

## 2024-06-21 LAB — PULMONARY FUNCTION TEST
DL/VA % pred: 126 %
DL/VA: 5.63 ml/min/mmHg/L
DLCO cor % pred: 73 %
DLCO cor: 28.41 ml/min/mmHg
DLCO unc % pred: 73 %
DLCO unc: 28.41 ml/min/mmHg
FEF 25-75 Post: 1.49 L/s
FEF 25-75 Pre: 3.95 L/s
FEF2575-%Change-Post: -62 %
FEF2575-%Pred-Post: 31 %
FEF2575-%Pred-Pre: 84 %
FEV1-%Change-Post: -33 %
FEV1-%Pred-Post: 45 %
FEV1-%Pred-Pre: 68 %
FEV1-Post: 2.38 L
FEV1-Pre: 3.57 L
FEV1FVC-%Change-Post: -30 %
FEV1FVC-%Pred-Pre: 104 %
FEV6-%Change-Post: -7 %
FEV6-%Pred-Post: 61 %
FEV6-%Pred-Pre: 66 %
FEV6-Post: 3.98 L
FEV6-Pre: 4.31 L
FEV6FVC-%Pred-Post: 102 %
FEV6FVC-%Pred-Pre: 102 %
FVC-%Change-Post: -4 %
FVC-%Pred-Post: 61 %
FVC-%Pred-Pre: 64 %
FVC-Post: 4.11 L
FVC-Pre: 4.32 L
Post FEV1/FVC ratio: 58 %
Post FEV6/FVC ratio: 100 %
Pre FEV1/FVC ratio: 83 %
Pre FEV6/FVC Ratio: 100 %
RV % pred: 50 %
RV: 1.17 L
TLC % pred: 62 %
TLC: 5.35 L

## 2024-06-21 MED ORDER — FLUTICASONE-SALMETEROL 100-50 MCG/ACT IN AEPB
INHALATION_SPRAY | RESPIRATORY_TRACT | 1 refills | Status: AC
  Filled 2024-06-21: qty 60, 30d supply, fill #0
  Filled 2024-09-08: qty 60, 30d supply, fill #1

## 2024-06-21 NOTE — Patient Instructions (Signed)
 Referral to hematology for workup of thrombophilia Multiple clots in the past  Continue blood thinners  Continue on cutting down smoking  Continue regular exercises  Will place order for lower dose of Advair   Call us  with significant concerns  Follow-up in 6 months

## 2024-06-21 NOTE — Progress Notes (Signed)
 Full PFT performed today.

## 2024-06-21 NOTE — Progress Notes (Signed)
 Craig Daugherty    [id number]    January 26, 1981  Primary Care Physician:Pcp, No  Referring Physician: No referring provider defined for this encounter.  Chief complaint:   Patient being seen for blood clots  HPI:  Patient with a history of pulmonary embolism last in November 2024 Had a previous blood clot in 2017 On review of his records from 2016 had had a history of blood clots prior to that as well he has some soreness in his legs with some swelling of his left leg Numbness in the legs hands, cramping  Does have shortness of breath on exertion Tries to stay active on a regular basis Walks on a regular basis can walk over a mile Does exercise regularly  Has been on Advair  250, feels to be tolerated by 100 better He does use albuterol  as needed  An active smoker, cutting down  Shortness of breath on exertion  Weather changes, humidity affects his breathing  Denies any pertinent occupational history  Currently uses Advair  which he uses once a day  Outpatient Encounter Medications as of 06/21/2024  Medication Sig   albuterol  (VENTOLIN  HFA) 108 (90 Base) MCG/ACT inhaler Inhale 1-2 puffs into the lungs every 6 (six) hours as needed for wheezing or shortness of breath.   fluticasone -salmeterol (ADVAIR  DISKUS) 100-50 MCG/ACT AEPB Inhale one puff into the lungs daily.   rivaroxaban  (XARELTO ) 20 MG TABS tablet Take one tablet (20 mg dose) by mouth daily.   Vitamin D , Ergocalciferol , (DRISDOL ) 1.25 MG (50000 UNIT) CAPS capsule Take 1 capsule (50,000 Units total) by mouth once a week.   fluticasone -salmeterol (ADVAIR ) 250-50 MCG/ACT AEPB Inhale 1 puff into the lungs every 12 (twelve) hours. (Patient not taking: Reported on 06/21/2024)   No facility-administered encounter medications on file as of 06/21/2024.    Allergies as of 06/21/2024 - Review Complete 03/17/2024  Allergen Reaction Noted   Tramadol Itching 12/29/2017    Past Medical History:  Diagnosis  Date   Pulmonary embolism (HCC)     No past surgical history on file.  No family history on file.  Social History   Socioeconomic History   Marital status: Single    Spouse name: Not on file   Number of children: Not on file   Years of education: Not on file   Highest education level: Not on file  Occupational History   Not on file  Tobacco Use   Smoking status: Some Days    Types: Cigarettes, Cigars   Smokeless tobacco: Never   Tobacco comments:    Black and mild cigars  Substance and Sexual Activity   Alcohol use: Yes    Comment: OCCASIONAL   Drug use: Yes    Types: Marijuana   Sexual activity: Not on file  Other Topics Concern   Not on file  Social History Narrative   Not on file   Social Drivers of Health   Financial Resource Strain: Low Risk  (12/23/2023)   Received from [facility]   Overall Financial Resource Strain (CARDIA)    Difficulty of Paying Living Expenses: Not hard at all  Food Insecurity: No Food Insecurity (12/23/2023)   Received from [facility]   Hunger Vital Sign    Within the past 12 months, you worried that your food would run out before you got the money to buy more.: Never true    Within the past 12 months, the food you bought just didn't last and you didn't  have money to get more.: Never true  Transportation Needs: No Transportation Needs (12/23/2023)   Received from [facility]   PRAPARE - Transportation    Lack of Transportation (Medical): No    Lack of Transportation (Non-Medical): No  Physical Activity: Not on file  Stress: Not on file  Social Connections: Not on file  Intimate Partner Violence: Not on file    Review of Systems  Respiratory:  Positive for cough and shortness of breath.   Cardiovascular:  Positive for leg swelling.    Vitals:   06/21/24 1039 06/21/24 1041  BP:  116/83  Pulse: (!) 110   SpO2: 93%      Physical Exam Constitutional:      Appearance: Normal appearance.  HENT:     Head:  Normocephalic.     Mouth/Throat:     Mouth: Mucous membranes are moist.  Eyes:     General: No scleral icterus. Cardiovascular:     Rate and Rhythm: Normal rate and regular rhythm.     Heart sounds: No murmur heard.    No friction rub.  Pulmonary:     Effort: No respiratory distress.     Breath sounds: No stridor. No wheezing or rhonchi.  Musculoskeletal:        General: No swelling.     Cervical back: No rigidity or tenderness.  Neurological:     Mental Status: He is alert.  Psychiatric:        Mood and Affect: Mood normal.    Data Reviewed: Most recent records from [facility] reviewed   Most recent CT 10/29/2023 reviewed showing a small chronic pulmonary embolus in the right lower lobe Bronchial wall thickening with scattered areas of airway impaction  Pulmonary function test reviewed showing moderate obstruction with no significant bronchodilator response, moderate restriction with mild reduction in diffusing capacity  Ultrasound of the lower extremity did not reveal any blood clots  Most recent echocardiogram 04/28/2024 with no evidence of pulmonary hypertension, normal ejection fraction  Assessment:  Shortness of breath on exertion  Probable asthma  Restrictive lung disease  History of multiple blood clots - Currently on Xarelto , compliant with Xarelto  - Has had multiple blood clots and the recommendation is to stay on blood thinners lifelong  His mother was present during the visit, questions regarding if further evaluation needs to be done - We agreed on referral to hematology for further workup  Plan/Recommendations: Referral to hematology for further workup of thrombophilia  Continue on blood thinners  Benefits outweigh risk of remaining on blood thinners  Continue albuterol   Reduce strength of Advair  to 100 from 250  Continue graded activities as tolerated  Smoking cessation counseling  I spent 30 minutes dedicated to the care of this patient on the  date of this encounter to include previsit review of records, face-to-face time with the patient discussing conditions above, post visit ordering of testing,ordering medications,independentlyinterpreting results, clinical documentation with electronic health record and communicated necessary findings to members of the patient's care team Jennet Epley MD [facility] 06/21/2024, 10:44 AM  CC: No ref. provider found

## 2024-06-21 NOTE — Patient Instructions (Signed)
 Full PFT performed today.

## 2024-06-23 ENCOUNTER — Other Ambulatory Visit (HOSPITAL_COMMUNITY): Payer: Self-pay

## 2024-06-28 ENCOUNTER — Other Ambulatory Visit (HOSPITAL_COMMUNITY): Payer: Self-pay

## 2024-07-07 ENCOUNTER — Other Ambulatory Visit (HOSPITAL_COMMUNITY): Payer: Self-pay

## 2024-07-26 ENCOUNTER — Other Ambulatory Visit (HOSPITAL_COMMUNITY): Payer: Self-pay

## 2024-07-26 ENCOUNTER — Other Ambulatory Visit: Payer: Self-pay | Admitting: Pulmonary Disease

## 2024-07-26 ENCOUNTER — Other Ambulatory Visit: Payer: Self-pay

## 2024-07-28 ENCOUNTER — Encounter (HOSPITAL_COMMUNITY): Payer: Self-pay

## 2024-07-28 ENCOUNTER — Other Ambulatory Visit (HOSPITAL_COMMUNITY): Payer: Self-pay

## 2024-07-29 ENCOUNTER — Other Ambulatory Visit (HOSPITAL_COMMUNITY): Payer: Self-pay

## 2024-08-02 ENCOUNTER — Other Ambulatory Visit: Payer: Self-pay | Admitting: Pulmonary Disease

## 2024-08-02 ENCOUNTER — Other Ambulatory Visit (HOSPITAL_COMMUNITY): Payer: Self-pay

## 2024-08-02 ENCOUNTER — Telehealth: Payer: Self-pay

## 2024-08-02 MED ORDER — ALBUTEROL SULFATE HFA 108 (90 BASE) MCG/ACT IN AERS
1.0000 | INHALATION_SPRAY | Freq: Four times a day (QID) | RESPIRATORY_TRACT | 1 refills | Status: DC | PRN
  Filled 2024-08-02: qty 18, 30d supply, fill #0
  Filled 2024-09-08: qty 18, 30d supply, fill #1

## 2024-08-02 NOTE — Telephone Encounter (Signed)
 Copied from CRM [id number]. Topic: Clinical - Prescription Issue >> Aug 02, 2024 10:15 AM Corean SAUNDERS wrote: Reason for CRM: Patients mother states that the patients pharmacy is not re filling the patients medication and he has been out of the albuterol  (VENTOLIN  HFA) 108 (90 Base) MCG/ACT inhaler and is very concerned he will run out of rivaroxaban  (XARELTO ) 20 MG TABS tablet before the medication is filled. Please send to [facility] [address] [place] [address] Phone: [phone] Fax: [phone]   Rx sent for ventolin  inhaler.  Dr. Neda can you please advise pt is requesting Xarelto  refills.

## 2024-08-03 NOTE — Telephone Encounter (Signed)
 Copied from CRM [id number]. Topic: Clinical - Medication Question >> Aug 02, 2024  2:31 PM Isabell A wrote: Reason for CRM: Patients mom is calling in regard to refills for rivaroxaban  (XARELTO ) 20 MG TABS tablet - states this is a prescription that the patient has to be on forever and this should've been filled she shouldn't have to call for refills. Requesting to speak to office.   Callback number: [phone]  Waiting for Dr. Neda response, Xarelto  was filled by different provider. Please advise if you would like to refill this.

## 2024-08-04 ENCOUNTER — Other Ambulatory Visit (HOSPITAL_COMMUNITY): Payer: Self-pay

## 2024-08-04 MED ORDER — RIVAROXABAN 20 MG PO TABS
ORAL_TABLET | ORAL | 0 refills | Status: AC
  Filled 2024-08-04: qty 90, 90d supply, fill #0

## 2024-08-09 NOTE — Telephone Encounter (Signed)
  Your pharmacy may be able to call the physician's office to have medications refilled if they are chronic medications.   Patients do need seen to ascertain they are still taking the medications on a regular basis and not having side effects from them.

## 2024-08-11 NOTE — Telephone Encounter (Signed)
 Reviewed chart and it looks like Dr Neda already sent 90 day supply of the Xarelto  08/04/24  At last ov, we placed referral to hem and the referral was closed due to 3 attempts of being unable to reach the pt  He needs to be referred again and when he sees them they should take over the refills for this   I called the pt to discuss but there was no answer- LMTCB   Note that there is no DPR on file, and we need to be speaking only with the pt.

## 2024-08-17 ENCOUNTER — Other Ambulatory Visit (HOSPITAL_COMMUNITY): Payer: Self-pay

## 2024-08-17 NOTE — Telephone Encounter (Signed)
 Called the pt again and still no answer- LMTCB and Closing per protocol.   I did call the [facility] pharm and confirmed that the xarelto  rx we prescribed was dispensed to pt 8/276/25.

## 2024-09-08 ENCOUNTER — Inpatient Hospital Stay: Attending: Oncology | Admitting: Oncology

## 2024-09-08 ENCOUNTER — Inpatient Hospital Stay

## 2024-09-08 ENCOUNTER — Other Ambulatory Visit (HOSPITAL_COMMUNITY): Payer: Self-pay

## 2024-09-08 ENCOUNTER — Other Ambulatory Visit: Payer: Self-pay

## 2024-09-08 ENCOUNTER — Encounter: Payer: Self-pay | Admitting: Oncology

## 2024-09-08 VITALS — BP 103/63 | HR 65 | Temp 97.7°F | Resp 18 | Ht 78.0 in | Wt 289.0 lb

## 2024-09-08 DIAGNOSIS — Z86718 Personal history of other venous thrombosis and embolism: Secondary | ICD-10-CM | POA: Insufficient documentation

## 2024-09-08 DIAGNOSIS — Z86711 Personal history of pulmonary embolism: Secondary | ICD-10-CM | POA: Diagnosis not present

## 2024-09-08 DIAGNOSIS — D649 Anemia, unspecified: Secondary | ICD-10-CM | POA: Insufficient documentation

## 2024-09-08 DIAGNOSIS — Z720 Tobacco use: Secondary | ICD-10-CM | POA: Insufficient documentation

## 2024-09-08 DIAGNOSIS — Z7901 Long term (current) use of anticoagulants: Secondary | ICD-10-CM | POA: Insufficient documentation

## 2024-09-08 LAB — CMP (CANCER CENTER ONLY)
ALT: 14 U/L (ref 0–44)
AST: 15 U/L (ref 15–41)
Albumin: 3.8 g/dL (ref 3.5–5.0)
Alkaline Phosphatase: 62 U/L (ref 38–126)
Anion gap: 3 — ABNORMAL LOW (ref 5–15)
BUN: 10 mg/dL (ref 6–20)
CO2: 29 mmol/L (ref 22–32)
Calcium: 8.9 mg/dL (ref 8.9–10.3)
Chloride: 108 mmol/L (ref 98–111)
Creatinine: 1.27 mg/dL — ABNORMAL HIGH (ref 0.61–1.24)
GFR, Estimated: >60 mL/min (ref >60)
Glucose, Bld: 81 mg/dL (ref 70–99)
Potassium: 4.4 mmol/L (ref 3.5–5.1)
Sodium: 140 mmol/L (ref 135–145)
Total Bilirubin: 0.8 mg/dL (ref 0.0–1.2)
Total Protein: 6.1 g/dL — ABNORMAL LOW (ref 6.5–8.1)

## 2024-09-08 LAB — CBC WITH DIFFERENTIAL (CANCER CENTER ONLY)
Abs Immature Granulocytes: 0.02 K/uL (ref 0.00–0.07)
Basophils Absolute: 0.1 K/uL (ref 0.0–0.1)
Basophils Relative: 1 %
Eosinophils Absolute: 0.4 K/uL (ref 0.0–0.5)
Eosinophils Relative: 7 %
HCT: 39.2 % (ref 39.0–52.0)
Hemoglobin: 12.9 g/dL — ABNORMAL LOW (ref 13.0–17.0)
Immature Granulocytes: 0 %
Lymphocytes Relative: 31 %
Lymphs Abs: 2 K/uL (ref 0.7–4.0)
MCH: 29.1 pg (ref 26.0–34.0)
MCHC: 32.9 g/dL (ref 30.0–36.0)
MCV: 88.5 fL (ref 80.0–100.0)
Monocytes Absolute: 0.7 K/uL (ref 0.1–1.0)
Monocytes Relative: 11 %
Neutro Abs: 3.4 K/uL (ref 1.7–7.7)
Neutrophils Relative %: 50 %
Platelet Count: 311 K/uL (ref 150–400)
RBC: 4.43 MIL/uL (ref 4.22–5.81)
RDW: 14.1 % (ref 11.5–15.5)
WBC Count: 6.6 K/uL (ref 4.0–10.5)
nRBC: 0 % (ref 0.0–0.2)

## 2024-09-08 LAB — D-DIMER, QUANTITATIVE: D-Dimer, Quant: <0.27 ug{FEU}/mL (ref 0.00–0.50)

## 2024-09-08 LAB — IRON AND IRON BINDING CAPACITY (CC-WL,HP ONLY)
Iron: 125 ug/dL (ref 45–182)
Saturation Ratios: 39 % (ref 17.9–39.5)
TIBC: 318 ug/dL (ref 250–450)
UIBC: 193 ug/dL (ref 117–376)

## 2024-09-08 LAB — FERRITIN: Ferritin: 52 ng/mL (ref 24–336)

## 2024-09-08 NOTE — Assessment & Plan Note (Addendum)
 Intermittent anemia noted, with a need to evaluate for potential causes. Family history of sickle cell disease may be relevant. -Hemoglobin 12.9 today.  Normal MCV.  Iron studies show no evidence of iron deficiency. - We will check hemoglobin electrophoresis to look for any hemoglobinopathies.

## 2024-09-08 NOTE — Progress Notes (Signed)
 [facility]  HEMATOLOGY CLINIC CONSULTATION NOTE   PATIENT NAME: Craig Daugherty   MR#: [id number] DOB: Sep 19, 1981  DATE OF SERVICE: 09/08/2024   REFERRING PROVIDER  Jennet Epley, MD.   Patient Care Team: Pcp, No as PCP - General Epley Jennet LABOR, MD as Consulting Physician (Pulmonary Disease)   REASON FOR CONSULTATION/ CHIEF COMPLAINT:  History of pulmonary embolism in November 2024.  Prior history of DVTs in 2016-2017.  ASSESSMENT & PLAN:  Craig Daugherty is a 43 y.o. gentleman with a past medical history of nicotine dependence, prior history of multiple blood clots and history of pulmonary embolism in November 2024, was referred to our service for evaluation of any underlying thrombophilia.    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 The November clot appeared to be an old clot, smaller than the initial one, and located in the same area. No new clots were identified in previous scans from 2017 and 2018. The clot's recurrence is unprovoked, with no identifiable triggering factors such as surgery, long travel, or immobilization. No current pain or breathing issues reported.  No family history of blood clots reported, but there is a family history of sickle cell disease.   - Given recurrent pulmonary embolism, thrombophilia workup would be necessary.  Today we will check for prothrombin gene mutation, factor V Leiden, beta-2  glycoprotein antibodies, cardiolipin antibodies, and lupus anticoagulant.  Will also check protein C activity, protein S activity, Antithrombin III  activity.  D-dimer is undetectable today.  He was advised to continue Xarelto  20 mg daily.  Regardless of test results, lifelong anticoagulation is recommended due to the risk of future clots.  - Arrange follow-up phone call in 2-3 weeks to discuss test  results.  - Consider implications of test results for family members if any mutations are found.  - Continue Xarelto  indefinitely due to recurrent unprovoked clots.  RTC in 6 months for follow-up with repeat labs.  Normocytic anemia Intermittent anemia noted, with a need to evaluate for potential causes. Family history of sickle cell disease may be relevant. -Hemoglobin 12.9 today.  Normal MCV.  Iron studies show no evidence of iron deficiency. - We will check hemoglobin electrophoresis to look for any hemoglobinopathies.   I reviewed lab results and outside records for this visit and discussed relevant results with the patient. Diagnosis, plan of care and treatment options were also discussed in detail with the patient. Opportunity provided to ask questions and answers provided to his apparent satisfaction. Provided instructions to call our clinic with any problems, questions or concerns prior to return visit. I recommended to continue follow-up with PCP and sub-specialists. He verbalized understanding and agreed with the plan. No barriers to learning was detected.  Chinita Patten, MD  09/08/2024 4:41 PM  [facility] - A DEPT OF [facility] [address] [place] [address] Dept: [phone] Dept Fax: [phone]   HISTORY OF PRESENT ILLNESS:  Discussed the use of AI scribe software for clinical note transcription with the patient, who gave verbal consent to proceed.  History of Present Illness Craig Daugherty is a 43 year old male who presents with recurrent blood clots. He is accompanied by his mother, Craig Daugherty. He was referred by his pulmonologist for evaluation of his history of clots.  He first experienced a blood clot in December 2016,  located in the right lower lobe of the lung, without any clots in the legs. At that time, he was not on bed rest or had any recent surgeries, long road trips, or flights.  He was working as a Music therapist and experienced sudden pain on the right side of his body and difficulty breathing, leading to hospitalization for a few days. He was treated with Xarelto  for about a year, and follow-up scans in March 2017, November 2017, and October 2018 showed no evidence of clots.  He has not experienced any pain or breathing issues recently and is currently not working. He is on Xarelto  again and tolerates it well.  There is no family history of blood clots, but there is a history of high blood pressure and diabetes. He has been experiencing some anemia from time to time, and there is a family history of sickle cell disease.  He is also seeing a lung doctor for possible asthma and is using inhalers. His breathing issues are influenced by the weather. No current pain or breathing issues.  He denies fever, cough, diarrhea, or other infectious symptoms.  He denies epistaxis, bloody stool, melena, hematuria, bruising or other bleeding symptoms. He also denies unintentional weight loss, night sweats or other constitutional symptoms.  MEDICAL HISTORY Past Medical History:  Diagnosis Date   Pulmonary embolism (HCC)      SURGICAL HISTORY No past surgical history on file.   SOCIAL HISTORY: He reports that he has been smoking cigarettes and cigars. He has never used smokeless tobacco. He reports current alcohol use. He reports current drug use. Drug: Marijuana. Social History   Socioeconomic History   Marital status: Single    Spouse name: Not on file   Number of children: Not on file   Years of education: Not on file   Highest education level: Not on file  Occupational History   Not on file  Tobacco Use   Smoking status: Some Days    Types: Cigarettes, Cigars   Smokeless tobacco: Never   Tobacco comments:    Black and mild cigars  Substance and Sexual Activity   Alcohol use: Yes    Comment: OCCASIONAL   Drug use: Yes    Types: Marijuana   Sexual activity: Not on  file  Other Topics Concern   Not on file  Social History Narrative   Not on file   Social Drivers of Health   Financial Resource Strain: Low Risk  (12/23/2023)   Received from [facility]   Overall Financial Resource Strain (CARDIA)    Difficulty of Paying Living Expenses: Not hard at all  Food Insecurity: Food Insecurity Present (09/08/2024)   Hunger Vital Sign    Worried About Running Out of Food in the Last Year: Sometimes true    Ran Out of Food in the Last Year: Sometimes true  Transportation Needs: No Transportation Needs (09/08/2024)   PRAPARE - Administrator, Civil Service (Medical): No    Lack of Transportation (Non-Medical): No  Physical Activity: Not on file  Stress: Not on file  Social Connections: Not on file  Intimate Partner Violence: Not At Risk (09/08/2024)   Humiliation, Afraid, Rape, and Kick questionnaire    Fear of Current or Ex-Partner: No    Emotionally Abused: No    Physically Abused: No    Sexually Abused: No    FAMILY HISTORY: His family history is not on file.  CURRENT MEDICATIONS   Current Outpatient Medications  Medication Instructions  albuterol  (VENTOLIN  HFA) 108 (90 Base) MCG/ACT inhaler 1-2 puffs, Inhalation, Every 6 hours PRN   fluticasone -salmeterol (ADVAIR  DISKUS) 100-50 MCG/ACT AEPB Inhale one puff into the lungs daily.   rivaroxaban  (XARELTO ) 20 MG TABS tablet Take one tablet (20 mg dose) by mouth daily.   Vitamin D  (Ergocalciferol ) (DRISDOL ) 50,000 Units, Oral, Weekly     ALLERGIES  He is allergic to tramadol.  REVIEW OF SYSTEMS:  Review of Systems - Oncology   Rest of the pertinent review of systems is unremarkable except as mentioned above in HPI.  PHYSICAL EXAMINATION:    Onc Performance Status - 09/08/24 1028       ECOG Perf Status   ECOG Perf Status Fully active, able to carry on all pre-disease performance without restriction      KPS SCALE   KPS % SCORE Normal, no compliants, no evidence of disease           Vitals:   09/08/24 1019  BP: 103/63  Pulse: 65  Resp: 18  Temp: 97.7 F (36.5 C)  SpO2: 100%   Filed Weights   09/08/24 1019  Weight: 289 lb (131.1 kg)    Physical Exam Constitutional:      General: He is not in acute distress.    Appearance: Normal appearance.  HENT:     Head: Normocephalic and atraumatic.  Eyes:     Conjunctiva/sclera: Conjunctivae normal.  Cardiovascular:     Rate and Rhythm: Normal rate and regular rhythm.  Pulmonary:     Effort: Pulmonary effort is normal. No respiratory distress.  Abdominal:     General: There is no distension.  Neurological:     General: No focal deficit present.     Mental Status: He is alert and oriented to person, place, and time.  Psychiatric:        Mood and Affect: Mood normal.        Behavior: Behavior normal.      LABORATORY DATA:   I have reviewed the data as listed.  Results for orders placed or performed in visit on 09/08/24  CBC with Differential (Cancer Center Only)  Result Value Ref Range   WBC Count 6.6 4.0 - 10.5 K/uL   RBC 4.43 4.22 - 5.81 MIL/uL   Hemoglobin 12.9 (L) 13.0 - 17.0 g/dL   HCT 60.7 60.9 - 47.9 %   MCV 88.5 80.0 - 100.0 fL   MCH 29.1 26.0 - 34.0 pg   MCHC 32.9 30.0 - 36.0 g/dL   RDW 85.8 88.4 - 84.4 %   Platelet Count 311 150 - 400 K/uL   nRBC 0.0 0.0 - 0.2 %   Neutrophils Relative % 50 %   Neutro Abs 3.4 1.7 - 7.7 K/uL   Lymphocytes Relative 31 %   Lymphs Abs 2.0 0.7 - 4.0 K/uL   Monocytes Relative 11 %   Monocytes Absolute 0.7 0.1 - 1.0 K/uL   Eosinophils Relative 7 %   Eosinophils Absolute 0.4 0.0 - 0.5 K/uL   Basophils Relative 1 %   Basophils Absolute 0.1 0.0 - 0.1 K/uL   Immature Granulocytes 0 %   Abs Immature Granulocytes 0.02 0.00 - 0.07 K/uL  CMP (Cancer Center only)  Result Value Ref Range   Sodium 140 135 - 145 mmol/L   Potassium 4.4 3.5 - 5.1 mmol/L   Chloride 108 98 - 111 mmol/L   CO2 29 22 - 32 mmol/L   Glucose, Bld 81 70 - 99 mg/dL   BUN 10  6 -  20 mg/dL   Creatinine 8.72 (H) 9.38 - 1.24 mg/dL   Calcium 8.9 8.9 - 89.6 mg/dL   Total Protein 6.1 (L) 6.5 - 8.1 g/dL   Albumin 3.8 3.5 - 5.0 g/dL   AST 15 15 - 41 U/L   ALT 14 0 - 44 U/L   Alkaline Phosphatase 62 38 - 126 U/L   Total Bilirubin 0.8 0.0 - 1.2 mg/dL   GFR, Estimated >39 >39 mL/min   Anion gap 3 (L) 5 - 15  D-dimer, quantitative  Result Value Ref Range   D-Dimer, Quant <0.27 0.00 - 0.50 ug/mL-FEU  Iron and Iron Binding Capacity ([facility] only)  Result Value Ref Range   Iron 125 45 - 182 ug/dL   TIBC 681 749 - 549 ug/dL   Saturation Ratios 39 17.9 - 39.5 %   UIBC 193 117 - 376 ug/dL  Ferritin  Result Value Ref Range   Ferritin 52 24 - 336 ng/mL     RADIOGRAPHIC STUDIES:  CLINICAL DATA:  Pulmonary embolism (PE), high prob; chest pain, dyspnea, RLQ abdominal pain   EXAM: CT ANGIOGRAPHY CHEST   CT ABDOMEN AND PELVIS WITH CONTRAST   TECHNIQUE: Multidetector CT imaging of the chest was performed using the standard protocol during bolus administration of intravenous contrast. Multiplanar CT image reconstructions and MIPs were obtained to evaluate the vascular anatomy. Multidetector CT imaging of the abdomen and pelvis was performed using the standard protocol during bolus administration of intravenous contrast.   RADIATION DOSE REDUCTION: This exam was performed according to the departmental dose-optimization program which includes automated exposure control, adjustment of the mA and/or kV according to patient size and/or use of iterative reconstruction technique.   CONTRAST:  75mL OMNIPAQUE  IOHEXOL  350 MG/ML SOLN   COMPARISON:  None Available.   FINDINGS: CTA CHEST FINDINGS   Cardiovascular: Adequate opacification of the pulmonary arterial tree. No intraluminal filling defect identified to suggest acute pulmonary embolism. There is an eccentric filling defect within the right lower lobar peripheral pulmonary artery seen on image # 75/3 keeping with  chronic pulmonary embolus. Central pulmonary arteries are of normal caliber. No significant coronary artery calcification. Cardiac size within normal limits. No pericardial effusion. No significant atherosclerotic calcification within the thoracic aorta. No aortic aneurysm.   Mediastinum/Nodes: No enlarged mediastinal, hilar, or axillary lymph nodes. Thyroid gland, trachea, and esophagus demonstrate no significant findings.   Lungs/Pleura: Imaging is limited by respiratory motion artifact. No focal pulmonary infiltrate or nodule. No pneumothorax or pleural effusion. No central obstructing lesion. There is mild diffuse bronchial wall thickening present with scattered areas of airway impaction best appreciated within the left lower lobe in keeping with airway inflammation.   Musculoskeletal: No chest wall abnormality. No acute or significant osseous findings.   Review of the MIP images confirms the above findings.   CT ABDOMEN and PELVIS FINDINGS   Hepatobiliary: No focal liver abnormality is seen. No gallstones, gallbladder wall thickening, or biliary dilatation.   Pancreas: Unremarkable. No pancreatic ductal dilatation or surrounding inflammatory changes.   Spleen: Unremarkable   Adrenals/Urinary Tract: Adrenal glands are unremarkable. Kidneys are normal, without renal calculi, focal lesion, or hydronephrosis. Bladder is unremarkable.   Stomach/Bowel: Stomach is within normal limits. Appendix appears normal. No evidence of bowel wall thickening, distention, or inflammatory changes.   Vascular/Lymphatic: No significant vascular findings are present. No enlarged abdominal or pelvic lymph nodes.   Reproductive: Prostate is unremarkable.   Other: No abdominal wall hernia or abnormality. No  abdominopelvic ascites.   Musculoskeletal: No acute or significant osseous findings.   Review of the MIP images confirms the above findings.   IMPRESSION: 1. No acute pulmonary  embolism. Small chronic pulmonary embolus noted within the right lower lobe. 2. Mild diffuse bronchial wall thickening with scattered areas of airway impaction in keeping with airway inflammation. No focal pulmonary infiltrate. 3. No acute intra-abdominal pathology identified.  Normal appendix.     Electronically Signed   By: Dorethia Molt M.D.   On: 11/06/2023 22:53  Orders Placed This Encounter  Procedures   CBC with Differential (Cancer Center Only)    Standing Status:   Future    Number of Occurrences:   1    Expiration Date:   09/08/2025   CMP (Cancer Center only)    Standing Status:   Future    Number of Occurrences:   1    Expiration Date:   09/08/2025   D-dimer, quantitative    Standing Status:   Future    Number of Occurrences:   1    Expiration Date:   09/08/2025   Cardiolipin antibodies, IgG, IgM, IgA    Standing Status:   Future    Number of Occurrences:   1    Expiration Date:   09/08/2025   Beta-2 -glycoprotein i abs, IgG/M/A    Standing Status:   Future    Number of Occurrences:   1    Expiration Date:   09/08/2025   Lupus anticoagulant panel    Standing Status:   Future    Number of Occurrences:   1    Expiration Date:   09/08/2025   Prothrombin gene mutation    Standing Status:   Future    Number of Occurrences:   1    Expiration Date:   09/08/2025   Factor 5 leiden    Standing Status:   Future    Number of Occurrences:   1    Expiration Date:   09/08/2025   Protein C activity    Standing Status:   Future    Number of Occurrences:   1    Expiration Date:   09/08/2025   Antithrombin panel    Standing Status:   Future    Number of Occurrences:   1    Expiration Date:   09/08/2025   PROTEIN S PANEL    Standing Status:   Future    Number of Occurrences:   1    Expiration Date:   09/08/2025   Hgb Fractionation Cascade    Standing Status:   Future    Number of Occurrences:   1    Expiration Date:   09/08/2025   Iron and Iron Binding Capacity ([facility]  only)    Standing Status:   Future    Number of Occurrences:   1    Expiration Date:   09/08/2025   Ferritin    Standing Status:   Future    Number of Occurrences:   1    Expiration Date:   09/08/2025   CBC with Differential (Cancer Center Only)    Standing Status:   Future    Expected Date:   03/16/2025    Expiration Date:   06/14/2025   D-dimer, quantitative    Standing Status:   Future    Expected Date:   03/16/2025    Expiration Date:   06/14/2025    Future Appointments  Date Time Provider Department Center  09/22/2024  2:45 PM  Lanina Larranaga, Chinita, MD [facility] None  03/16/2025 11:15 AM [facility] None  03/16/2025 11:45 AM Firmin Belisle, MD [facility] None    I spent a total of 55 minutes during this encounter with the patient including review of chart and various tests results, discussions about plan of care and coordination of care plan.  This document was completed utilizing speech recognition software. Grammatical errors, random word insertions, pronoun errors, and incomplete sentences are an occasional consequence of this system due to software limitations, ambient noise, and hardware issues. Any formal questions or concerns about the content, text or information contained within the body of this dictation should be directly addressed to the provider for clarification.

## 2024-09-08 NOTE — Assessment & Plan Note (Addendum)
 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 The November clot appeared to be an old clot, smaller than the initial one, and located in the same area. No new clots were identified in previous scans from 2017 and 2018. The clot's recurrence is unprovoked, with no identifiable triggering factors such as surgery, long travel, or immobilization. No current pain or breathing issues reported.  No family history of blood clots reported, but there is a family history of sickle cell disease.   - Given recurrent pulmonary embolism, thrombophilia workup would be necessary.  Today we will check for prothrombin gene mutation, factor V Leiden, beta-2  glycoprotein antibodies, cardiolipin antibodies, and lupus anticoagulant.  Will also check protein C activity, protein S activity, Antithrombin III  activity.  D-dimer is undetectable today.  He was advised to continue Xarelto  20 mg daily.  Regardless of test results, lifelong anticoagulation is recommended due to the risk of future clots.  - Arrange follow-up phone call in 2-3 weeks to discuss test results.  - Consider implications of test results for family members if any mutations are found.  - Continue Xarelto  indefinitely due to recurrent unprovoked clots.  RTC in 6 months for follow-up with repeat labs.

## 2024-09-10 LAB — PROTEIN S PANEL
Protein S Activity: 104 % (ref 63–140)
Protein S Ag, Free: 107 % (ref 61–136)
Protein S Ag, Total: 80 % (ref 60–150)

## 2024-09-10 LAB — ANTITHROMBIN PANEL
AT III AG PPP IMM-ACNC: 116 % (ref 72–124)
Antithrombin Activity: 128 % (ref 75–135)

## 2024-09-10 LAB — PROTEIN C ACTIVITY: Protein C Activity: 125 % (ref 73–180)

## 2024-09-11 LAB — LUPUS ANTICOAGULANT PANEL
DRVVT: 51.2 s — ABNORMAL HIGH (ref 0.0–47.0)
PTT Lupus Anticoagulant: 35.3 s (ref 0.0–43.5)

## 2024-09-11 LAB — BETA-2-GLYCOPROTEIN I ABS, IGG/M/A
Beta-2 Glyco I IgG: <9 GPI IgG units (ref 0–20)
Beta-2-Glycoprotein I IgA: <9 GPI IgA units (ref 0–25)
Beta-2-Glycoprotein I IgM: <9 GPI IgM units (ref 0–32)

## 2024-09-11 LAB — DRVVT MIX: dRVVT Mix: 44.2 s — ABNORMAL HIGH (ref 0.0–40.4)

## 2024-09-11 LAB — DRVVT CONFIRM: dRVVT Confirm: 1.1 ratio (ref 0.8–1.2)

## 2024-09-12 LAB — HGB FRACTIONATION CASCADE
Hgb A2: 2.8 % (ref 1.8–3.2)
Hgb A: 97.2 % (ref 96.4–98.8)
Hgb F: 0 % (ref 0.0–2.0)
Hgb S: 0 %

## 2024-09-13 LAB — FACTOR 5 LEIDEN

## 2024-09-13 LAB — PROTHROMBIN GENE MUTATION

## 2024-09-16 LAB — CARDIOLIPIN ANTIBODIES, IGG, IGM, IGA
Anticardiolipin IgA: <9 U/mL (ref 0–11)
Anticardiolipin IgG: <9 GPL U/mL (ref 0–14)
Anticardiolipin IgM: <9 [MPL'U]/mL (ref 0–12)

## 2024-09-22 ENCOUNTER — Encounter: Payer: Self-pay | Admitting: Oncology

## 2024-09-22 ENCOUNTER — Inpatient Hospital Stay (HOSPITAL_BASED_OUTPATIENT_CLINIC_OR_DEPARTMENT_OTHER): Admitting: Oncology

## 2024-09-22 DIAGNOSIS — D649 Anemia, unspecified: Secondary | ICD-10-CM | POA: Diagnosis not present

## 2024-09-22 DIAGNOSIS — Z86711 Personal history of pulmonary embolism: Secondary | ICD-10-CM

## 2024-09-22 NOTE — Progress Notes (Signed)
 [facility]  HEMATOLOGY-ONCOLOGY ELECTRONIC VISIT PROGRESS NOTE  PATIENT NAME: Craig Daugherty   MR#: [id number] DOB: 1981-03-29  DATE OF SERVICE: 09/22/2024  Patient Care Team: Pcp, No as PCP - General Neda Jennet LABOR, MD as Consulting Physician (Pulmonary Disease)  I connected with the patient via telephone conference and verified that I am speaking with the correct person using two identifiers. The patient's location is at home and I am providing care from the [facility].  I discussed the limitations, risks, security and privacy concerns of performing an evaluation and management service by e-visits and the availability of in person appointments. I also discussed with the patient that there may be a patient responsible charge related to this service. The patient expressed understanding and agreed to proceed.   ASSESSMENT & PLAN:   Craig Daugherty is a 43 y.o. gentleman with a past medical history of nicotine dependence, prior history of multiple blood clots and history of pulmonary embolism in November 2024, was referred to our service for evaluation of any underlying thrombophilia.    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 The November clot appeared to be an old clot, smaller than the initial one, and located in the same area. No new clots were identified in previous scans from 2017 and 2018. The clot's recurrence is unprovoked, with no identifiable triggering factors such as surgery, long travel, or immobilization. No current pain or breathing issues reported.  No family history of blood clots reported, but there is a family history of sickle cell disease.   Given recurrent pulmonary embolism, thrombophilia workup was pursued on his consultation with us  on 09/08/2024. Prothrombin gene mutation, factor V Leiden, beta-2  glycoprotein  antibodies, cardiolipin antibodies, and lupus anticoagulant were all negative. Protein C activity, protein S activity, Antithrombin III  activity were all within normal limits.  D-dimer was undetectable.  Grossly negative thrombophilia workup.   Regardless of test results, lifelong anticoagulation is recommended due to the risk of future clots.  He was advised to continue Xarelto  20 mg orally daily.  RTC in 6 months for follow-up with repeat labs.  Normocytic anemia Intermittent anemia noted, with a need to evaluate for potential causes. Family history of sickle cell disease.  -Hemoglobin 12.9, close to normal on 09/08/2024.  Normal MCV.  Iron studies showed no evidence of iron deficiency. - We checked hemoglobin electrophoresis to look for any hemoglobinopathies and it showed unremarkable pattern.  No additional workup needed at current levels  Restrictive lung disease Restrictive lung disease characterized by impaired gas exchange. No evidence of malignancy. - Continue current management with albuterol  inhaler as needed. - Patient is seeking second opinion at a different pulmonology office and he was advised to follow-up with his PCP regarding this  Chronic kidney disease, unspecified stage Mild kidney dysfunction with creatinine level at 1.27, slightly elevated from the normal of 1.2, possibly due to mild dehydration on the day of testing. - Encourage hydration with at least 70 to 80 ounces of water daily.  I discussed the assessment and treatment plan with the patient. The patient was provided an opportunity to ask questions and all were answered. The patient agreed with the plan and demonstrated an understanding of the instructions. The patient was advised to call back or seek an in-person evaluation if the symptoms worsen or if the condition fails to improve as anticipated.    I spent  12 minutes over the phone with the patient reviewing test results, discuss management and  coordination/planning of care.  Chinita Patten, MD 09/22/2024 3:39 PM [facility] - A DEPT [facility] [address] [place] [address] Dept: [phone] Dept Fax: [phone]   INTERVAL HISTORY:  Please see above for problem oriented charting.  The purpose of today's discussion is to explain recent lab results and to formulate plan of care.  Discussed the use of AI scribe software for clinical note transcription with the patient, who gave verbal consent to proceed.  History of Present Illness Craig Daugherty is a 43 year old male with a history of multiple blood clots who presents for follow-up on recent workup results.  He has a history of multiple blood clots and is currently on anticoagulation therapy. Recent workup included a D-dimer test, which was negative, and genetic testing for clotting mutations, which was also negative. Despite these results, he continues on anticoagulation therapy due to his history of thrombosis.  He has mild kidney dysfunction with a creatinine level of 1.27, which is slightly elevated.  He has been diagnosed with restrictive lung disease and uses albuterol  inhalers, which provide symptomatic relief. He previously used Advair  but discontinued it due to adverse effects. He is not currently using Advair .   SUMMARY OF HEMATOLOGY HISTORY:   He was referred by his pulmonologist for evaluation of his history of clots.   He first experienced a blood clot in December 2016, located in the right lower lobe of the lung, without any clots in the legs. At that time, he was not on bed rest or had any recent surgeries, long road trips, or flights. He was working as a Music therapist and experienced sudden pain on the right side of his body and difficulty breathing, leading to hospitalization for a few days. He was treated with Xarelto  for about a year, and follow-up scans in March 2017,  November 2017, and October 2018 showed no evidence of clots.   He has not experienced any pain or breathing issues recently and is currently not working. He is on Xarelto  again and tolerates it well.   There is no family history of blood clots, but there is a history of high blood pressure and diabetes. He has been experiencing some anemia from time to time, and there is a family history of sickle cell disease.   He is also seeing a lung doctor for possible asthma and is using inhalers. His breathing issues are influenced by the weather. No current pain or breathing issues.  The clot's recurrence is unprovoked, with no identifiable triggering factors such as surgery, long travel, or immobilization. No current pain or breathing issues reported.   Given recurrent pulmonary embolism, thrombophilia workup was pursued on his consultation with us  on 09/08/2024. Prothrombin gene mutation, factor V Leiden, beta-2  glycoprotein antibodies, cardiolipin antibodies, and lupus anticoagulant were all negative. Protein C activity, protein S activity, Antithrombin III  activity were all within normal limits.  D-dimer was undetectable.  Grossly negative thrombophilia workup.   Regardless of test results, lifelong anticoagulation is recommended due to the risk of future clots.  He was advised to continue Xarelto  20 mg orally daily.   REVIEW OF SYSTEMS:    Review of Systems - Oncology  All other pertinent systems were reviewed with the patient and are negative.  I have reviewed the past medical history, past surgical history, social history and  family history with the patient and they are unchanged from previous note.  ALLERGIES:  He is allergic to tramadol.  MEDICATIONS:  Current Outpatient Medications  Medication Sig Dispense Refill   albuterol  (VENTOLIN  HFA) 108 (90 Base) MCG/ACT inhaler Inhale 1-2 puffs into the lungs every 6 (six) hours as needed for wheezing or shortness of breath. 18 g 1    fluticasone -salmeterol (ADVAIR  DISKUS) 100-50 MCG/ACT AEPB Inhale one puff into the lungs daily. 60 each 1   rivaroxaban  (XARELTO ) 20 MG TABS tablet Take one tablet (20 mg dose) by mouth daily. 90 tablet 0   Vitamin D , Ergocalciferol , (DRISDOL ) 1.25 MG (50000 UNIT) CAPS capsule Take 1 capsule (50,000 Units total) by mouth once a week. (Patient not taking: Reported on 09/08/2024) 12 capsule 3   No current facility-administered medications for this visit.    PHYSICAL EXAMINATION:  Not done today, as it was a phone only visit.    LABORATORY DATA:   I have reviewed the data as listed.  Recent Results (from the past 2160 hours)  CBC with Differential (Cancer Center Only)     Status: Abnormal   Collection Time: 09/08/24 11:38 AM  Result Value Ref Range   WBC Count 6.6 4.0 - 10.5 K/uL   RBC 4.43 4.22 - 5.81 MIL/uL   Hemoglobin 12.9 (L) 13.0 - 17.0 g/dL   HCT 60.7 60.9 - 47.9 %   MCV 88.5 80.0 - 100.0 fL   MCH 29.1 26.0 - 34.0 pg   MCHC 32.9 30.0 - 36.0 g/dL   RDW 85.8 88.4 - 84.4 %   Platelet Count 311 150 - 400 K/uL   nRBC 0.0 0.0 - 0.2 %   Neutrophils Relative % 50 %   Neutro Abs 3.4 1.7 - 7.7 K/uL   Lymphocytes Relative 31 %   Lymphs Abs 2.0 0.7 - 4.0 K/uL   Monocytes Relative 11 %   Monocytes Absolute 0.7 0.1 - 1.0 K/uL   Eosinophils Relative 7 %   Eosinophils Absolute 0.4 0.0 - 0.5 K/uL   Basophils Relative 1 %   Basophils Absolute 0.1 0.0 - 0.1 K/uL   Immature Granulocytes 0 %   Abs Immature Granulocytes 0.02 0.00 - 0.07 K/uL    Comment: Performed at [facility], [address], [place], [place] [address]  CMP (Cancer Center only)     Status: Abnormal   Collection Time: 09/08/24 11:38 AM  Result Value Ref Range   Sodium 140 135 - 145 mmol/L   Potassium 4.4 3.5 - 5.1 mmol/L   Chloride 108 98 - 111 mmol/L   CO2 29 22 - 32 mmol/L   Glucose, Bld 81 70 - 99 mg/dL    Comment: Glucose reference range applies only to samples taken after fasting for  at least 8 hours.   BUN 10 6 - 20 mg/dL   Creatinine 8.72 (H) 9.38 - 1.24 mg/dL   Calcium 8.9 8.9 - 89.6 mg/dL   Total Protein 6.1 (L) 6.5 - 8.1 g/dL   Albumin 3.8 3.5 - 5.0 g/dL   AST 15 15 - 41 U/L   ALT 14 0 - 44 U/L   Alkaline Phosphatase 62 38 - 126 U/L   Total Bilirubin 0.8 0.0 - 1.2 mg/dL   GFR, Estimated >39 >39 mL/min    Comment: (NOTE) Calculated using the CKD-EPI Creatinine Equation (2021)    Anion gap 3 (L) 5 - 15    Comment: Performed at [facility], [address] [place], [place] [address]  D-dimer, quantitative     Status: None   Collection Time: 09/08/24 11:38 AM  Result Value Ref Range   D-Dimer, Quant <0.27 0.00 - 0.50 ug/mL-FEU    Comment: (NOTE) At the manufacturer cut-off value of 0.5 g/mL FEU, this assay has a negative predictive value of 95-100%.This assay is intended for use in conjunction with a clinical pretest probability (PTP) assessment model to exclude pulmonary embolism (PE) and deep venous thrombosis (DVT) in outpatients suspected of PE or DVT. Results should be correlated with clinical presentation. Performed at [facility], [address], [place], [place] [address]   Cardiolipin antibodies, IgG, IgM, IgA     Status: None   Collection Time: 09/08/24 11:38 AM  Result Value Ref Range   Anticardiolipin IgG <9 0 - 14 GPL U/mL    Comment: (NOTE)                          Negative:              <15                          Indeterminate:     15 - 20                          Low-Med Positive: >20 - 80                          High Positive:         >80    Anticardiolipin IgM <9 0 - 12 MPL U/mL    Comment: (NOTE)                          Negative:              <13                          Indeterminate:     13 - 20                          Low-Med Positive: >20 - 80                          High Positive:         >80    Anticardiolipin IgA <9 0 - 11 APL U/mL    Comment: (NOTE)                           Negative:              <12                          Indeterminate:     12 - 20                          Low-Med Positive: >20 - 80                          High Positive:         >  80 Performed At: [facility] [address] [place], [place] [id number] Jennette Shorter MD Ey:[phone]   Beta-2 -glycoprotein i abs, IgG/M/A     Status: None   Collection Time: 09/08/24 11:38 AM  Result Value Ref Range   Beta-2  Glyco I IgG <9 0 - 20 GPI IgG units   Beta-2 -Glycoprotein I IgM <9 0 - 32 GPI IgM units    Comment: (NOTE) Performed At: [facility] [address] [place], [place] [id number] Jennette Shorter MD Ey:[id number]    Beta-2 -Glycoprotein I IgA <9 0 - 25 GPI IgA units  Lupus anticoagulant panel     Status: Abnormal   Collection Time: 09/08/24 11:38 AM  Result Value Ref Range   PTT Lupus Anticoagulant 35.3 0.0 - 43.5 sec   DRVVT 51.2 (H) 0.0 - 47.0 sec   Lupus Anticoag Interp Comment:     Comment: (NOTE) No lupus anticoagulant was detected. These results are consistent with specific inhibitors to one or more common pathway factors (X, V, II or fibrinogen). Performed At: [facility] [address] [place], [place] [id number] Jennette Shorter MD Ey:[phone]   Prothrombin gene mutation     Status: None   Collection Time: 09/08/24 11:38 AM  Result Value Ref Range   Recommendations-PTGENE: Comment     Comment: (NOTE) Result: c.*97G>A - Not Detected This result is not associated with an increased risk for venous thromboembolism. See Additional Clinical Information and Comments. Additional Clinical Information: Venous thromboembolism is a multifactorial disease influenced by genetic, environmental, and circumstantial risk factors. The c.*97G>A variant in the F2 gene is a genetic risk factor for venous thromboembolism. Heterozygous carriers have a 2- to 4-fold increased risk for venous thromboembolism. Homozygotes for the c.*97G>A variant are rare. The  annual risk of VTE in homozygotes has been reported to be 1.1%/year. Individuals who carry both a c.*97G>A variant in the F2 gene and a c.1601G>A (p. Arg534Gln) variant in the F5 gene (commonly referred to as Factor V Leiden) have an approximately 20- fold increased risk for venous thromboembolism. Risks are likely to be even higher in more complex genotype combinations involving the F2 c.*97G>A variant and Factor V Leiden (PMID:  66325232). Additional risk factors include but are not limited to: deficiency of protein C, protein S, or antithrombin III , age, male sex, personal or family history of deep vein thromboembolism, smoking, surgery, prolonged immobilization, malignant neoplasm, tamoxifen treatment, raloxifene treatment, oral contraceptive use, hormone replacement therapy, and pregnancy. Management of thrombotic risk and thrombotic events should follow established guidelines and fit the clinical circumstance. This result cannot predict the occurrence or recurrence of a thrombotic event. Comments: Genetic counseling is recommended to discuss the potential clinical implications of positive results, as well as recommendations for testing family members. Genetic Coordinators are available for health care providers to discuss results at [phone]). Test Details: Variant analyzed: c.*97G>A, previously referred to as G20210A Methods/Limitations: DNA analysis of the F2 gene (NM_000 506.5) was performed by PCR amplification followed by restriction enzyme analysis. The diagnostic sensitivity is >99%. Results must be combined with clinical information for the most accurate interpretation. Molecular-based testing is highly accurate, but as in any laboratory test, diagnostic errors may occur. False positive or false negative results may occur for reasons that include genetic variants, blood transfusions, bone marrow transplantation, somatic or tissue-specific  mosaicism, mislabeled samples, or erroneous representation of family relationships. This test was developed and its performance characteristics determined by [facility]. It has not been cleared or approved by the Food and Drug  Administration. References: Bhatt S, Taylor AK, Lozano R, Grody [facility], Signa [facility]; ACMG Professional Practice and Guidelines Committee. Addendum: [employer] of Medical Genetics consensus statement on factor V Leiden mutation testing. Genet Med. 2021 Mar 5. doi: 89.8961/d585 [phone]-x. PMID: 66325232. Hosey RUSH. Prothrombin Thrombophilia. 2006 Jul 25 [Updated 2021 Feb 4]. In: Juliene POSNER, Ardinger HH, Pagon RA, et al., editors. GeneReviews(R) [Internet]. [address] ([place]): [employer] , [place]; 8006-7978. Available from: [email or web address].com/ Laurita GORMAN Waddell BRIDGETT, Huang X, Luo B, Spector EB, Ileana SHAUNNA Gal CS; ACMG Laboratory Quality Assurance Committee. Venous thromboembolism laboratory testing (factor V Leiden and factor II c.*97G>A), 2018 update: a technical standard of the [employer] of [employer] and Genomics (ACMG). Genet Med. 2018 Dec;20(12):1489-1498. doi: 10.1038/[id number]-z. Epub 2018 Oct 5. PMID: 69702301.    Reviewed by: Comment     Comment: (NOTE) Technical Component performed at [facility] RTP Professional Component performed by: Toni R. Prezant, PhD, [facility] TPTGD5, [facility], [address] [place] [address] Performed At: [facility] RTP [address] [place], [place] [id number] Loran Gales MDPhD Ey:[id number]   Factor 5 leiden     Status: None   Collection Time: 09/08/24 11:38 AM  Result Value Ref Range   Recommendations-F5LEID: Comment     Comment: (NOTE) Result: c.1601G>A (p.Arg534Gln) - Not Detected This result is not associated with an increased risk for venous thromboembolism. See Additional Clinical Information and Comments. Additional Clinical Information:    Venous  thromboembolism is a multifactorial disease influenced by genetic, environmental, and circumstantial risk factors. The c.1601G>A (p. Arg534Gln) variant in the F5 gene, commonly referred to as Factor V Leiden, is a genetic risk factor for venous thromboembolism. Heterozygous carriers of this variant have a 6- to 8- fold increased risk for venous thromboembolism. Individuals homozygous for this variant (ie, with a copy of the variant on each chromosome) have an approximately 80-fold increased risk for venous thromboembolism. Individuals who carry both a c.*97G>A variant in the F2 gene and Factor V Leiden have an approximately 20-fold increased risk for venous thromboembolism. Risks are likely to be even higher in more complex genotype combinations  involving the F2 c.*97G>A variant and Factor V Leiden (PMID: 66325232). Additional risk factors include but are not limited to: deficiency of protein C, protein S, or antithrombin III , age, male sex, personal or family history of deep vein thromboembolism, smoking, surgery, prolonged immobilization, malignant neoplasm, tamoxifen treatment, raloxifene treatment, oral contraceptive use, hormone replacement therapy, and pregnancy. Management of thrombotic risk and thrombotic events should follow established guidelines and fit the clinical circumstance. This result cannot predict the occurrence or recurrence of a thrombotic event. Comment:    Genetic counseling is recommended to discuss the potential clinical implications of positive results, as well as recommendations for testing family members.    Genetic Coordinators are available for health care providers to discuss results at [phone]). Test Details:    Variant Analyzed: c.1601G>A (p. Arg534Gln), referre d to as Factor V Leiden Methods/Limitations:    DNA analysis of the F5 gene (NM_000130.5) was performed by PCR amplification followed by electrophoresis. The  diagnostic sensitivity is >99%. Results must be combined with clinical information for the most accurate interpretation. Molecular-based testing is highly accurate, but as in any laboratory test, diagnostic errors may occur. False positive or false negative results may occur for reasons that include genetic variants, blood transfusions, bone marrow transplantation, somatic or tissue-specific mosaicism, mislabeled samples, or erroneous representation of family relationships.    This test was developed and its  performance characteristics determined by [facility]. It has not been cleared or approved by the Food and Drug Administration. References:    Bhatt S, Taylor AK, Lozano R, Grody [facility], Signa [facility]; ACMG Professional Practice and Guidelines Committee. Addendum: [employer] of Medical Genetics consensus statemen t on factor V Leiden mutation testing. Genet Med. 2021 Mar 5. doi: 89.8961/d58563-978- 01108-x. PMID: 66325232.    Hosey RUSH. Factor V Leiden Thrombophilia. 1999 May 14 (Updated 2018 Jan 4). In: Juliene POSNER, Ardinger HH, Pagon RA, et al., editors. GeneReviews(R) (Internet). [address] [facility]): [employer] , [place]; 8006-7978. Available from: [email or web address]-mcgee.org/    Laurita GORMAN Waddell BRIDGETT, Huang X, Luo B, Spector EB, Ileana SHAUNNA Gal CS; ACMG Laboratory Quality Assurance Committee. Venous thromboembolism laboratory testing (factor V Leiden and factor II c. *97G>A), 2018 update: a technical standard of the [employer] of [employer] and Genomics (ACMG). Genet Med. 2018 Izr;79(87): 8510-8501. doi: 10.1038/s41436-(684) 689-3888-z. Epub 2018 Oct 5. PMID: 69702301.    Reviewed By: Comment     Comment: (NOTE) Technical Component performed at [facility] RTP Professional Component performed by: Melissa A. Elberta, PhD, [facility] MHTGD8, [facility], [address] [place] [address] Performed At: [facility] RTP [address] [place], [place] [id number] Loran Gales MDPhD Ey:[id number]   Protein C activity     Status: None   Collection Time: 09/08/24 11:38 AM  Result Value Ref Range   Protein C Activity 125 73 - 180 %    Comment: (NOTE) Performed At: [facility] [address] [place], [place] [id number] Jennette Shorter MD Ey:[id number]   Antithrombin panel     Status: None   Collection Time: 09/08/24 11:38 AM  Result Value Ref Range   Antithrombin Activity 128 75 - 135 %    Comment: (NOTE) Direct Xa inhibitor anticoagulants such as rivaroxaban , apixaban and edoxaban will lead to spuriously elevated antithrombin activity levels possibly masking a deficiency.    AT III AG PPP IMM-ACNC 116 72 - 124 %    Comment: (NOTE) This test was developed and its performance characteristics determined by [facility]. It has not been cleared or approved by the Food and Drug Administration. Performed At: [facility] [address] [place], [place] [id number] Jennette Shorter MD Ey:[id number]   PROTEIN S PANEL     Status: None   Collection Time: 09/08/24 11:38 AM  Result Value Ref Range   Protein S Ag, Total 80 60 - 150 %    Comment: (NOTE) This test was developed and its performance characteristics determined by [facility]. It has not been cleared or approved by the Food and Drug Administration.    Protein S Ag, Free 107 61 - 136 %   Protein S Activity 104 63 - 140 %    Comment: (NOTE) Protein S activity may be falsely increased (masking an abnormal, low result) in patients receiving direct Xa inhibitor (e.g., rivaroxaban , apixaban, edoxaban) or a direct thrombin inhibitor (e.g., dabigatran) anticoagulant treatment due to assay interference by these drugs. Performed At: [facility] [address] [place], [place] [id number] Jennette Shorter MD Ey:[id number]   Hgb Fractionation Cascade     Status: None   Collection Time: 09/08/24 11:38 AM  Result Value Ref Range   Hgb F 0.0 0.0 - 2.0 %   Hgb A 97.2 96.4 - 98.8 %   Hgb A2  2.8 1.8 - 3.2 %   Hgb S 0.0 0.0 %   Interpretation, Hgb Fract Comment     Comment: (NOTE) Normal hemoglobin present;  no hemoglobin variant or beta thalassemia identified. Note: Alpha thalassemia may not be detected by the Hgb Fractionation Cascade panel. If alpha thalassemia is suspected, [facility] offers Alpha-Thalassemia Analysis [phone]). Performed At: [facility] [address] [place], [place] [id number] Jennette Shorter MD Ey:[id number]   Iron and Iron Binding Capacity (CC-WL,HP only)     Status: None   Collection Time: 09/08/24 11:38 AM  Result Value Ref Range   Iron 125 45 - 182 ug/dL   TIBC 681 749 - 549 ug/dL   Saturation Ratios 39 17.9 - 39.5 %   UIBC 193 117 - 376 ug/dL    Comment: Performed at [facility], [address], [place], [place] [address]  Ferritin     Status: None   Collection Time: 09/08/24 11:38 AM  Result Value Ref Range   Ferritin 52 24 - 336 ng/mL    Comment: Performed at [facility], [address], [place], [place] [address]  dRVVT Mix     Status: Abnormal   Collection Time: 09/08/24 11:38 AM  Result Value Ref Range   dRVVT Mix 44.2 (H) 0.0 - 40.4 sec    Comment: (NOTE) Performed At: [facility] [address] [place], [place] [id number] Jennette Shorter MD Ey:[id number]   dRVVT Confirm     Status: None   Collection Time: 09/08/24 11:38 AM  Result Value Ref Range   dRVVT Confirm 1.1 0.8 - 1.2 ratio    Comment: (NOTE) Performed At: [facility] [address] [place], [place] [id number] Jennette Shorter MD Ey:[id number]      RADIOGRAPHIC STUDIES:  No recent pertinent imaging studies available to review.   Future Appointments  Date Time Provider Department Center  03/16/2025 11:15 AM [facility] None  03/16/2025 11:45 AM Aryahi Denzler, Chinita, MD [facility] None    This document was completed utilizing speech recognition software. Grammatical errors, random  word insertions, pronoun errors, and incomplete sentences are an occasional consequence of this system due to software limitations, ambient noise, and hardware issues. Any formal questions or concerns about the content, text or information contained within the body of this dictation should be directly addressed to the provider for clarification.

## 2024-09-22 NOTE — Assessment & Plan Note (Signed)
 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 The November clot appeared to be an old clot, smaller than the initial one, and located in the same area. No new clots were identified in previous scans from 2017 and 2018. The clot's recurrence is unprovoked, with no identifiable triggering factors such as surgery, long travel, or immobilization. No current pain or breathing issues reported.  No family history of blood clots reported, but there is a family history of sickle cell disease.   Given recurrent pulmonary embolism, thrombophilia workup was pursued on his consultation with us  on 09/08/2024. Prothrombin gene mutation, factor V Leiden, beta-2  glycoprotein antibodies, cardiolipin antibodies, and lupus anticoagulant were all negative. Protein C activity, protein S activity, Antithrombin III  activity were all within normal limits.  D-dimer was undetectable.  Grossly negative thrombophilia workup.   Regardless of test results, lifelong anticoagulation is recommended due to the risk of future clots.  He was advised to continue Xarelto  20 mg orally daily.  RTC in 6 months for follow-up with repeat labs.

## 2024-09-22 NOTE — Assessment & Plan Note (Signed)
 Intermittent anemia noted, with a need to evaluate for potential causes. Family history of sickle cell disease.  -Hemoglobin 12.9, close to normal on 09/08/2024.  Normal MCV.  Iron studies showed no evidence of iron deficiency. - We checked hemoglobin electrophoresis to look for any hemoglobinopathies and it showed unremarkable pattern.  No additional workup needed at current levels

## 2024-10-31 ENCOUNTER — Other Ambulatory Visit: Payer: Self-pay | Admitting: Pulmonary Disease

## 2024-10-31 ENCOUNTER — Other Ambulatory Visit (HOSPITAL_COMMUNITY): Payer: Self-pay

## 2024-11-01 ENCOUNTER — Other Ambulatory Visit: Payer: Self-pay

## 2024-11-01 ENCOUNTER — Other Ambulatory Visit (HOSPITAL_COMMUNITY): Payer: Self-pay

## 2024-11-01 MED ORDER — VITAMIN D (ERGOCALCIFEROL) 1.25 MG (50000 UNIT) PO CAPS
50000.0000 [IU] | ORAL_CAPSULE | ORAL | 3 refills | Status: AC
  Filled 2024-11-01 – 2024-12-30 (×2): qty 12, 84d supply, fill #0

## 2024-11-01 MED ORDER — ALBUTEROL SULFATE HFA 108 (90 BASE) MCG/ACT IN AERS
1.0000 | INHALATION_SPRAY | Freq: Four times a day (QID) | RESPIRATORY_TRACT | 1 refills | Status: AC | PRN
  Filled 2024-11-01: qty 6.7, 25d supply, fill #0
  Filled 2024-12-30: qty 18, 30d supply, fill #0

## 2024-11-10 ENCOUNTER — Other Ambulatory Visit (HOSPITAL_COMMUNITY): Payer: Self-pay

## 2024-12-30 ENCOUNTER — Other Ambulatory Visit (HOSPITAL_COMMUNITY): Payer: Self-pay

## 2025-01-06 ENCOUNTER — Other Ambulatory Visit (HOSPITAL_COMMUNITY): Payer: Self-pay

## 2025-01-06 ENCOUNTER — Other Ambulatory Visit: Payer: Self-pay

## 2025-01-06 MED ORDER — VITAMIN D (ERGOCALCIFEROL) 1.25 MG (50000 UNIT) PO CAPS
50000.0000 [IU] | ORAL_CAPSULE | ORAL | 3 refills | Status: AC
  Filled 2025-01-06: qty 12, 84d supply, fill #0

## 2025-01-06 MED ORDER — XARELTO 20 MG PO TABS
20.0000 mg | ORAL_TABLET | Freq: Every day | ORAL | 0 refills | Status: AC
  Filled 2025-01-06: qty 90, 90d supply, fill #0

## 2025-01-14 ENCOUNTER — Other Ambulatory Visit (HOSPITAL_COMMUNITY): Payer: Self-pay

## 2025-03-16 ENCOUNTER — Inpatient Hospital Stay

## 2025-03-16 ENCOUNTER — Inpatient Hospital Stay: Admitting: Oncology
